# Patient Record
Sex: Male | Born: 1965 | Race: Black or African American | Hispanic: No | Marital: Married | Smoking: Never smoker
Health system: Southern US, Community
[De-identification: ages and names within clinical notes are randomized; demographics above are authoritative.]

## PROBLEM LIST (undated history)

## (undated) DIAGNOSIS — E119 Type 2 diabetes mellitus without complications: Secondary | ICD-10-CM

## (undated) DIAGNOSIS — E785 Hyperlipidemia, unspecified: Secondary | ICD-10-CM

## (undated) DIAGNOSIS — R011 Cardiac murmur, unspecified: Secondary | ICD-10-CM

## (undated) DIAGNOSIS — G4730 Sleep apnea, unspecified: Secondary | ICD-10-CM

## (undated) HISTORY — DX: Cardiac murmur, unspecified: R01.1

## (undated) HISTORY — DX: Hyperlipidemia, unspecified: E78.5

## (undated) HISTORY — DX: Type 2 diabetes mellitus without complications: E11.9

## (undated) HISTORY — DX: Sleep apnea, unspecified: G47.30

---

## 2001-12-06 ENCOUNTER — Ambulatory Visit (HOSPITAL_BASED_OUTPATIENT_CLINIC_OR_DEPARTMENT_OTHER): Admission: RE | Admit: 2001-12-06 | Discharge: 2001-12-06 | Payer: Self-pay | Admitting: Otolaryngology

## 2002-10-28 ENCOUNTER — Ambulatory Visit (HOSPITAL_BASED_OUTPATIENT_CLINIC_OR_DEPARTMENT_OTHER): Admission: RE | Admit: 2002-10-28 | Discharge: 2002-10-28 | Payer: Self-pay | Admitting: Otolaryngology

## 2003-11-12 HISTORY — PX: SINUS SURGERY WITH INSTATRAK: SHX5215

## 2008-06-15 ENCOUNTER — Emergency Department (HOSPITAL_COMMUNITY): Admission: EM | Admit: 2008-06-15 | Discharge: 2008-06-15 | Payer: Self-pay | Admitting: Emergency Medicine

## 2009-05-22 ENCOUNTER — Ambulatory Visit: Payer: Self-pay | Admitting: Family Medicine

## 2009-05-22 DIAGNOSIS — E785 Hyperlipidemia, unspecified: Secondary | ICD-10-CM

## 2009-05-22 DIAGNOSIS — R011 Cardiac murmur, unspecified: Secondary | ICD-10-CM | POA: Insufficient documentation

## 2009-05-22 DIAGNOSIS — D573 Sickle-cell trait: Secondary | ICD-10-CM

## 2009-05-22 DIAGNOSIS — G4730 Sleep apnea, unspecified: Secondary | ICD-10-CM | POA: Insufficient documentation

## 2009-05-22 LAB — CONVERTED CEMR LAB
Cholesterol, target level: 200 mg/dL
HDL goal, serum: 40 mg/dL

## 2009-05-29 ENCOUNTER — Ambulatory Visit: Payer: Self-pay | Admitting: Family Medicine

## 2009-05-29 DIAGNOSIS — R7309 Other abnormal glucose: Secondary | ICD-10-CM | POA: Insufficient documentation

## 2009-05-29 LAB — CONVERTED CEMR LAB
ALT: 25 units/L (ref 0–53)
Alkaline Phosphatase: 43 units/L (ref 39–117)
Chloride: 107 meq/L (ref 96–112)
Cholesterol: 127 mg/dL (ref 0–200)
Creatinine, Ser: 1 mg/dL (ref 0.4–1.5)
GFR calc non Af Amer: 104.6 mL/min (ref >60)
Glucose, Bld: 119 mg/dL — ABNORMAL HIGH (ref 70–99)
Potassium: 3.9 meq/L (ref 3.5–5.1)
TSH: 1.77 microintl units/mL (ref 0.35–5.50)
Total Protein: 6.7 g/dL (ref 6.0–8.3)

## 2009-07-17 ENCOUNTER — Emergency Department (HOSPITAL_COMMUNITY): Admission: EM | Admit: 2009-07-17 | Discharge: 2009-07-17 | Payer: Self-pay | Admitting: Emergency Medicine

## 2009-08-14 ENCOUNTER — Encounter: Admission: RE | Admit: 2009-08-14 | Discharge: 2009-08-14 | Payer: Self-pay | Admitting: Otolaryngology

## 2009-11-16 ENCOUNTER — Ambulatory Visit: Payer: Self-pay | Admitting: Family Medicine

## 2009-11-30 ENCOUNTER — Ambulatory Visit: Payer: Self-pay | Admitting: Family Medicine

## 2009-11-30 LAB — CONVERTED CEMR LAB
ALT: 32 units/L (ref 0–53)
Alkaline Phosphatase: 43 units/L (ref 39–117)
BUN: 10 mg/dL (ref 6–23)
Bilirubin, Direct: 0.1 mg/dL (ref 0.0–0.3)
CO2: 30 meq/L (ref 19–32)
Calcium: 9.2 mg/dL (ref 8.4–10.5)
Chloride: 105 meq/L (ref 96–112)
Cholesterol: 177 mg/dL (ref 0–200)
Creatinine, Ser: 1.1 mg/dL (ref 0.4–1.5)
HDL: 27.4 mg/dL — ABNORMAL LOW (ref >39.00)
Potassium: 3.8 meq/L (ref 3.5–5.1)
Sodium: 141 meq/L (ref 135–145)
Total Bilirubin: 1.7 mg/dL — ABNORMAL HIGH (ref 0.3–1.2)
Triglycerides: 91 mg/dL (ref 0.0–149.0)

## 2009-12-04 ENCOUNTER — Encounter (INDEPENDENT_AMBULATORY_CARE_PROVIDER_SITE_OTHER): Payer: Self-pay | Admitting: *Deleted

## 2009-12-08 ENCOUNTER — Telehealth: Payer: Self-pay | Admitting: Family Medicine

## 2010-03-06 ENCOUNTER — Ambulatory Visit: Payer: Self-pay | Admitting: Family Medicine

## 2010-03-06 DIAGNOSIS — S139XXA Sprain of joints and ligaments of unspecified parts of neck, initial encounter: Secondary | ICD-10-CM | POA: Insufficient documentation

## 2010-03-22 ENCOUNTER — Ambulatory Visit: Payer: Self-pay | Admitting: Family Medicine

## 2010-03-22 DIAGNOSIS — M67919 Unspecified disorder of synovium and tendon, unspecified shoulder: Secondary | ICD-10-CM | POA: Insufficient documentation

## 2010-03-22 DIAGNOSIS — R209 Unspecified disturbances of skin sensation: Secondary | ICD-10-CM

## 2010-03-22 DIAGNOSIS — M719 Bursopathy, unspecified: Secondary | ICD-10-CM

## 2010-10-31 ENCOUNTER — Encounter: Payer: Self-pay | Admitting: Family Medicine

## 2010-10-31 ENCOUNTER — Ambulatory Visit: Payer: Self-pay | Admitting: Family Medicine

## 2010-10-31 DIAGNOSIS — R079 Chest pain, unspecified: Secondary | ICD-10-CM

## 2010-11-11 HISTORY — PX: OTHER SURGICAL HISTORY: SHX169

## 2010-11-13 ENCOUNTER — Ambulatory Visit: Admission: RE | Admit: 2010-11-13 | Discharge: 2010-11-13 | Payer: Self-pay | Source: Home / Self Care

## 2010-11-13 ENCOUNTER — Ambulatory Visit
Admission: RE | Admit: 2010-11-13 | Discharge: 2010-11-13 | Payer: Self-pay | Source: Home / Self Care | Attending: Physician Assistant | Admitting: Physician Assistant

## 2010-12-10 ENCOUNTER — Encounter: Payer: Self-pay | Admitting: Family Medicine

## 2010-12-10 ENCOUNTER — Telehealth: Payer: Self-pay | Admitting: Family Medicine

## 2010-12-11 NOTE — Assessment & Plan Note (Signed)
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  The neck morning had a lot of pain i nhis neck and shoulder then came to the doctor.   Took some flexeril.    his range of motion and pain in the shoulder is actually improved. His neck pain is improved. He still does have some pain in his neck and trap. His primary concern is the paresthesias that he has been intermittently feeling  Allergies (verified): No Known Drug Allergies  Past History:  Past medical, surgical, family and social histories (including risk factors) reviewed, and no changes noted (except as noted below).  Past Medical History: Reviewed history from 05/22/2009 and no changes required. Hyperlipidemia  Past Surgical History: Reviewed history from 05/22/2009 and no changes required. Sinus surgery 2005  Family History: Reviewed history from 05/22/2009 and no changes required. mother: lymphoma father: healthy half brother: Doreatha Martin disease siblings: healthy MGM: dementia no MI less than age 23  Social History: Reviewed history from 05/22/2009 and no changes required. 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 Diet: some fast food, fruits and  veggies, water  Review of Systems       REVIEW OF SYSTEMS  GEN: No systemic complaints, no fevers, chills, sweats, or other acute illnesses MSK: Detailed in the HPI GI: tolerating PO intake without difficulty Neuro: as described above, no weakness. Otherwise the pertinent positives of the ROS are noted above.    Physical Exam  General:  GEN: Well-developed,well-nourished,in no acute distress; alert,appropriate and cooperative throughout examination HEENT: Normocephalic and atraumatic without obvious abnormalities. No apparent alopecia or balding. Ears, externally no deformities PULM: Breathing comfortably in no respiratory distress EXT: No clubbing, cyanosis, or edema PSYCH: Normally interactive. Cooperative during the interview. Pleasant. Friendly and conversant. Not anxious or depressed appearing. Normal, full affect.  Msk:  neck: Full range of motion, negative Spurling's. Nontender in all bony prominences. He does have some mild paracervical spinal tenderness.  Full range of motion. Strength testing is 5/5.  Positive a.c. crossover compression test. Mildly tender at the acromioclavicular joint.  Mildly tender arc of motion. Negative Jobe test. Negative sulcus test.  mild + Neer test. Positive Leanord Asal test.   Impression & Recommendations:  Problem # 1:  ROTATOR CUFF SYNDROME, LEFT (ICD-726.10) patient has some mild a.c.  joint  pain, likely sustained a  mild grade 1 separation.  Additionally, he is having some rotator cuff tendinopathy.  He also is having some paresthesias in his left hand.  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 These typically do resolve with time, though they can take weeks to months.  Think it is reasonable to do a short course of oral steroid  given RTC and scapular stabilization routine from [facility]  Problem # 2:  NECK SPRAIN AND STRAIN (ICD-847.0)  The following medications were removed from the medication  list:    Diclofenac Sodium 75 Mg Tbec (Diclofenac sodium) .Marland Kitchen... 1 tab by mouth two times a day x 2weeks    Cyclobenzaprine Hcl 5 Mg Tabs (Cyclobenzaprine hcl) .Marland Kitchen... 1-2 tab by mouth at bedtime as needed muscle spasm  Problem # 3:  PARESTHESIA (ICD-782.0) Assessment: New  Complete Medication List: 1)  Prednisone 20 Mg Tabs (Prednisone) .... 2 tabs by mouth daily for a week Prescriptions: PREDNISONE 20 MG TABS (PREDNISONE) 2 tabs by mouth daily for a week  #14 x 0   Entered and Authorized by:   Hannah Beat MD   Signed by:   Hannah Beat MD on 03/22/2010   Method used:   [facility] to        [facility] (retail)       [address]       [place], [place]  [address]       Ph: [phone] or [phone]       Fax: [phone]   RxID:   [id number]   Prior Medications (reviewed today): None Current Allergies (reviewed today): No known allergies

## 2010-12-11 NOTE — Assessment & Plan Note (Signed)
Summary: STOMACH PAIN/CLE   Vital Signs:  Patient profile:   45 year old male Height:      77.5 inches Weight:      252.0 pounds BMI:     29.61 Temp:     98.1 degrees F oral Pulse rate:   60 / minute Pulse rhythm:   regular BP sitting:   132 / 80  (left arm) Cuff size:   large  Vitals Entered By: Benny Lennert CMA Duncan Dull) (November 16, 2009 4:19 PM)  History of Present Illness: Chief complaint stomach pain  1 year ago abdominal pain.Marland Kitchen given meds..? what resolved. 3 months later pain with ejaculation.Marland Kitchengiven cipro for prostatitis. 1 month ago saw Urologist...recommended 60 day treatment Took 30 days and was feeling well..stopped medicaiton 1 week ago.now symptom has returned.  Had some stomach bloating.  left lower abdominal pain, intermittant...worse when ejaculate.  no retrograde ejaculate. no dysuria. No constipation, no diarrhea.  No blood in stool. No fever. No testicular pain.   Had neg UA and   Problems Prior to Update: 1)  Prediabetes  (ICD-790.29) 2)  Skin Tag  (ICD-701.9) 3)  Sore Throat  (ICD-462) 4)  Sickle Cell Trait  (ICD-282.5) 5)  Heart Murmur, Benign  (ICD-785.2) 6)  Sleep Apnea  (ICD-780.57) 7)  Hyperlipidemia  (ICD-272.4)  Current Medications (verified): 1)  Lipitor 10 Mg Tabs (Atorvastatin Calcium) .... Take 1 Tablet By Mouth Once A Day 2)  Cpap  Allergies (verified): No Known Drug Allergies  Past History:  Past medical, surgical, family and social histories (including risk factors) reviewed, and no changes noted (except as noted below).  Past Medical History: Reviewed history from 05/22/2009 and no changes required. Hyperlipidemia  Past Surgical History: Reviewed history from 05/22/2009 and no changes required. Sinus surgery 2005  Family History: Reviewed history from 05/22/2009 and no changes required. mother: lymphoma father: healthy half brother: Doreatha Martin disease siblings: healthy MGM: dementia no MI less than age  24  Social History: Reviewed history from 05/22/2009 and no changes required. 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 Diet: some fast food, fruits and veggies, water  Review of Systems General:  Denies fatigue and fever. CV:  Denies chest pain or discomfort. Resp:  Denies shortness of breath. GI:  Denies gas, indigestion, nausea, and vomiting. GU:  Denies decreased libido, discharge, dysuria, genital sores, nocturia, urinary frequency, and urinary hesitancy.  Physical Exam  General:  Well-developed,well-nourished,in no acute distress; alert,appropriate and cooperative throughout examination Mouth:  MMM Lungs:  Normal respiratory effort, chest expands symmetrically. Lungs are clear to auscultation, no crackles or wheezes. Heart:  Normal rate and regular rhythm. S1 and S2 normal without gallop, murmur, click, rub or other extra sounds. Rectal:  refused.Marland Kitchendone at uro 1 month ago  Genitalia:  refused.Marland Kitchendone at uro 1 month ago  Prostate:  refused.Marland Kitchendone at uro 1 month ago    Impression & Recommendations:  Problem # 1:  PROSTATITIS, ACUTE (ICD-601.0) Symptoms correspond with acute/ recurrent bacterial prostatitis. Spent 20 min face to face explaining diagnosis and appropriate treatment as provided by URO. Info given. Recommended restarting antibitoics as was directed by urologist...to complete 60 days treatment. If symptoms return return to [facility] for further recs.   Refused reeval of prostate, genitalia  exam and repeat UA.  Complete Medication List: 1)  Lipitor 10 Mg Tabs (Atorvastatin calcium) .... Take 1 tablet by mouth once a  day 2)  Cpap   Patient Instructions: 1)  Restart antibitoics to complete 60 days course.   Current Allergies (reviewed today): No known allergies

## 2010-12-11 NOTE — Letter (Signed)
Summary: Generic Letter  [facility]  [address] [place], [place] [address]   Phone: [phone]  Fax: [phone]    12/04/2009     Brandon Walls [address] [place], [place]  [address]    Dear Mr. [name],   Notify pt that good chol still low but blood sugar lower than previously...continue working onexercise, weight loss and diet changes.  Make follow up labs prior to CPX in 6 months..CMET, lipids Dx 272.0. Kerby Nora MD  November 30, 2009 5:01 PM   Please call and schedule these appt at your convience.     Sincerely,   Benny Lennert CMA (AAMA)

## 2010-12-11 NOTE — Assessment & Plan Note (Signed)
Summary: 2:45PM / PAIN ON LEFT SIDE NECK & SHOULDER / LFW   Vital Signs:  Patient profile:   45 year old male Height:      77.5 inches Weight:      241.0 pounds BMI:     28.31 Temp:     98.4 degrees F oral Pulse rate:   84 / minute Pulse rhythm:   regular BP sitting:   118 / 80  (left arm) Cuff size:   large  Vitals Entered By: Benny Lennert CMA Duncan Dull) (March 06, 2010 3:04 PM)  History of Present Illness: Chief complaint pain in neck and shoulder for 5 days in left side  5 days ago..when pulling on someting that gave way..arm/ shoulder pulled forward quickly. Awoke next day with left neck stiffness and left lateral shoulder. Some weakness with throwing football in last few days. No numness.  No shooting pain radiating to arm. Iced initially...no meds.   No past history of injury to left arm/shoulder neck.   Problems Prior to Update: 1)  Prostatitis, Acute  (ICD-601.0) 2)  Prediabetes  (ICD-790.29) 3)  Skin Tag  (ICD-701.9) 4)  Sore Throat  (ICD-462) 5)  Sickle Cell Trait  (ICD-282.5) 6)  Heart Murmur, Benign  (ICD-785.2) 7)  Sleep Apnea  (ICD-780.57) 8)  Hyperlipidemia  (ICD-272.4)  Current Medications (verified): 1)  Lipitor 10 Mg Tabs (Atorvastatin Calcium) .... Take 1 Tablet By Mouth Once A Day 2)  Cpap 3)  Diclofenac Sodium 75 Mg Tbec (Diclofenac Sodium) .Marland Kitchen.. 1 Tab By Mouth Two Times A Day X 2weeks 4)  Cyclobenzaprine Hcl 5 Mg Tabs (Cyclobenzaprine Hcl) .Marland Kitchen.. 1-2 Tab By Mouth At Bedtime As Needed Muscle Spasm  Allergies (verified): No Known Drug Allergies  Past History:  Past medical, surgical, family and social histories (including risk factors) reviewed, and no changes noted (except as noted below).  Past Medical History: Reviewed history from 05/22/2009 and no changes required. Hyperlipidemia  Past Surgical History: Reviewed history from 05/22/2009 and no changes required. Sinus surgery 2005  Family History: Reviewed history from 05/22/2009 and no  changes required. mother: lymphoma father: healthy half brother: Doreatha Martin disease siblings: healthy MGM: dementia no MI less than age 21  Social History: Reviewed history from 05/22/2009 and no changes required. 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 Diet: some fast food, fruits and veggies, water  Review of Systems General:  Denies fatigue and fever. ENT:  currently getting over viral URI. CV:  Denies chest pain or discomfort and swelling of feet. Resp:  Denies shortness of breath. GI:  Denies abdominal pain, bloody stools, and constipation.  Physical Exam  General:  Well-developed,well-nourished,in no acute distress; alert,appropriate and cooperative throughout examination Mouth:  Oral mucosa and oropharynx without lesions or exudates.  Teeth in good repair. Neck:  no cervical or supraclavicular lymphadenopathy no carotid bruit or thyromegaly  Lungs:  Normal respiratory effort, chest expands symmetrically. Lungs are clear to auscultation, no crackles or wheezes. Heart:  Normal rate and regular rhythm. S1 and S2 normal without gallop, murmur, click, rub or other extra sounds. Msk:  ttp over trapezius, muscle spasm ttp in post subacromial area Mildly pos Neer's, Neg drop arm Full strength throughout   Impression & Recommendations:  Problem # 1:  NECK SPRAIN AND STRAIN (ICD-847.0) No evidence of radiculopathy or fracture. Treat with NSAIDs, stretching exercises, ice. Limit lifting and repetitive motion. Use muscle  relaxant as needed.  His updated medication list for this problem includes:    Diclofenac Sodium 75 Mg Tbec (Diclofenac sodium) .Marland Kitchen... 1 tab by mouth two times a day x 2weeks    Cyclobenzaprine Hcl 5 Mg Tabs (Cyclobenzaprine hcl) .Marland Kitchen... 1-2 tab by mouth at bedtime as needed muscle spasm  Problem # 2:  SHOULDER PAIN, LEFT  (ICD-719.41)  His updated medication list for this problem includes:    Diclofenac Sodium 75 Mg Tbec (Diclofenac sodium) .Marland Kitchen... 1 tab by mouth two times a day x 2weeks    Cyclobenzaprine Hcl 5 Mg Tabs (Cyclobenzaprine hcl) .Marland Kitchen... 1-2 tab by mouth at bedtime as needed muscle spasm  Complete Medication List: 1)  Lipitor 10 Mg Tabs (Atorvastatin calcium) .... Take 1 tablet by mouth once a day 2)  Cpap  3)  Diclofenac Sodium 75 Mg Tbec (Diclofenac sodium) .Marland Kitchen.. 1 tab by mouth two times a day x 2weeks 4)  Cyclobenzaprine Hcl 5 Mg Tabs (Cyclobenzaprine hcl) .Marland Kitchen.. 1-2 tab by mouth at bedtime as needed muscle spasm  Patient Instructions: 1)  No lifting greater than 10 lbs or repetitive shoulder reaching above head or shoulder inversion/eversion x 10 days. 2)  Antiinflammatory two times a day, muscle relaxant as needed at night.  3)  Gentle stretching and strenghtening exercsies.  4)  Follow up if not improving in 2 weeks.  Prescriptions: CYCLOBENZAPRINE HCL 5 MG TABS (CYCLOBENZAPRINE HCL) 1-2 tab by mouth at bedtime as needed muscle spasm  #20 x 0   Entered and Authorized by:   Kerby Nora MD   Signed by:   Kerby Nora MD on 03/06/2010   Method used:   Print then Give to Patient   RxID:   [id number] DICLOFENAC SODIUM 75 MG TBEC (DICLOFENAC SODIUM) 1 tab by mouth two times a day x 2weeks  #30 x 0   Entered and Authorized by:   Kerby Nora MD   Signed by:   Kerby Nora MD on 03/06/2010   Method used:   Print then Give to Patient   RxID:   [id number]   Current Allergies (reviewed today): No known allergies

## 2010-12-11 NOTE — Progress Notes (Signed)
Summary: fever and cough  Phone Note Call from Patient Call back at Home Phone [phone]   Caller: Patient Call For: Kerby Nora MD Summary of Call: Pt states he has cough, fever up to 102, achy, headaches- x 2-3 days.  Fever wont come down without tylenol.  He is resting and getting fluids.  He didnt get a flu shot.  Please advise on what to do. Initial call taken by: Lowella Petties CMA,  December 08, 2009 12:45 PM  Follow-up for Phone Call        Possible influenza...if no SOB.Marland Kitchencontinue home management. Rest fluids and antipyretic. If interested in tamiflu.. oif symptroms started in last 48-72 hours he is candidate..helps some with decreasing symptoms and length of symptoms..but not much.   Follow-up by: Kerby Nora MD,  December 08, 2009 2:12 PM  Additional Follow-up for Phone Call Additional follow up Details #1::        Patient notified as instructed.  Would like Rx for Tamiflu.  Dr. Ermalene Searing gave verbal order to send in Tamiflu 75mg , #10 with no refills.  [facility] Additional Follow-up by: Linde Gillis CMA Duncan Dull),  December 08, 2009 3:11 PM    New/Updated Medications: TAMIFLU 75 MG CAPS (OSELTAMIVIR PHOSPHATE) take one tablet twice daily for five days Prescriptions: TAMIFLU 75 MG CAPS (OSELTAMIVIR PHOSPHATE) take one tablet twice daily for five days  #10 x 0   Entered by:   Linde Gillis CMA (AAMA)   Authorized by:   Kerby Nora MD   Signed by:   Linde Gillis CMA (AAMA) on 12/08/2009   Method used:   [facility]        [facility] [address] [id number]       [address]       [place], [place]  [address]       Ph: [phone] or [phone]       Fax: [phone]   RxID:   [id number]   Prior Medications: LIPITOR 10 MG TABS (ATORVASTATIN CALCIUM) Take 1 tablet by mouth once a day CPAP ()  Current Allergies: No known allergies

## 2010-12-13 NOTE — Assessment & Plan Note (Signed)
Summary: ?PULLED MUSCLE,PAIN IN CHEST/CLE   Vital Signs:  Patient profile:   45 year old male Height:      77.5 inches Weight:      251 pounds BMI:     29.49 Temp:     98.7 degrees F oral Pulse rate:   84 / minute Pulse rhythm:   regular BP sitting:   120 / 70  (left arm) Cuff size:   regular  Vitals Entered By: Benny Lennert CMA Duncan Dull) (October 31, 2010 10:51 AM)  History of Present Illness: Chief complaint ? pulled muscle pain in chest  44 year old male:  Chest pain:  Past few weeks, after working out, tight through the whole day. Was feeling fine. Laying on his left side.  Does weights - but does not bother when doing some cardio.  Works tight when up in the chest area.   chest pain, substernally, that will last for hours after onset.  Does not begin with exertional component. No smoking history. No diabetes. No hypertension. He does have a history of hyperlipidemia, however most recently his numbers look pretty good.  Mother does have a history of myocardial infarction at age 64. No other cardiac risk factors. He is obese.   Mom: MI at 42, CAD.   Clinical Review Panels:  Lipid Management   Cholesterol:  177 (11/30/2009)   LDL (bad choesterol):  131 (11/30/2009)   HDL (good cholesterol):  27.40 (11/30/2009)  Complete Metabolic Panel   Glucose:  109 (11/30/2009)   Sodium:  141 (11/30/2009)   Potassium:  3.8 (11/30/2009)   Chloride:  105 (11/30/2009)   CO2:  30 (11/30/2009)   BUN:  10 (11/30/2009)   Creatinine:  1.1 (11/30/2009)   Albumin:  4.1 (11/30/2009)   Total Protein:  7.0 (11/30/2009)   Calcium:  9.2 (11/30/2009)   Total Bili:  1.7 (11/30/2009)   Alk Phos:  43 (11/30/2009)   SGPT (ALT):  32 (11/30/2009)   SGOT (AST):  22 (11/30/2009)   Allergies (verified): No Known Drug Allergies  Past History:  Past medical, surgical, family and social histories (including risk factors) reviewed, and no changes noted (except as noted below).  Past  Medical History: Reviewed history from 05/22/2009 and no changes required. Hyperlipidemia  Past Surgical History: Reviewed history from 05/22/2009 and no changes required. Sinus surgery 2005  Family History: Reviewed history from 05/22/2009 and no changes required. mother: lymphoma father: healthy half brother: Doreatha Martin disease siblings: healthy MGM: dementia no MI less than age 29  Social History: Reviewed history from 05/22/2009 and no changes required. 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 Diet: some fast food, fruits and veggies, water  Review of Systems General:  Denies chills, fatigue, and fever. CV:  Complains of chest pain or discomfort; denies bluish discoloration of lips or nails, difficulty breathing at night, difficulty breathing while lying down, fainting, fatigue, leg cramps with exertion, lightheadness, near fainting, palpitations, shortness of breath with exertion, swelling of feet, swelling of hands, and weight gain. Resp:  Denies chest pain with inspiration.  Physical Exam  General:  Well-developed,well-nourished,in no acute distress; alert,appropriate and cooperative throughout examination Head:  Normocephalic and atraumatic without obvious abnormalities. No apparent alopecia or balding. Ears:  no external deformities.   Nose:  no external deformity.   Chest Wall:  some minimal pain with sternal compression. Lungs:  Normal respiratory effort, chest expands  symmetrically. Lungs are clear to auscultation, no crackles or wheezes. Heart:  Normal rate and regular rhythm. S1 and S2 normal without gallop, murmur, click, rub or other extra sounds.   Impression & Recommendations:  Problem # 1:  CHEST PAIN (ICD-786.50) Assessment New EKG: Normal sinus rhythm. Normal axis, normal R wave progression, No acute ST elevation or  depression. pr 240, inverted T in III.  45 year old black male substernal chest pain, atypical. Mother with a history of myocardial infarction age 35 and a history of hyperlipidemia. Will obtain an exercise tolerance test for risk stratification. He is able currently to exercise well, and I advised him to push the Bruce protocol.  If his stress test is positive, cardiology consult urgently.  I think this is more likely costochondritis, and I have advised him Alleve 220 mg p.o. b.i.d.  Orders: Cardiology Referral (Cardiology) EKG w/ Interpretation (93000)  Patient Instructions: 1)  Alleve 2 tabs by mouth two times a day over the counter, take at least for 2 - 3 weeks. This is a prescription strength dose.  2)  Referral Appointment Information 3)  Day/Date: 4)  Time: 5)  Place/MD: 6)  Address: 7)  Phone/Fax: 8)  Patient given appointment information. Information/Orders faxed/mailed.    Orders Added: 1)  Cardiology Referral [Cardiology] 2)  Est. Patient Level IV [19147] 3)  EKG w/ Interpretation [93000]    Prior Medications (reviewed today): None Current Allergies (reviewed today): No known allergies

## 2010-12-19 NOTE — Progress Notes (Signed)
  Phone Note Other Incoming   Details for Reason: Old Records Summary of Call: reivewed old records. Pt due for CPX and fasting labs prior. Please schedule.   No past tetanus seen.  Initial call taken by: Kerby Nora MD,  December 10, 2010 1:44 PM  Follow-up for Phone Call        Patient has appt in march for labs and cpx Follow-up by: Benny Lennert CMA Duncan Dull),  December 11, 2010 11:08 AM

## 2011-01-02 NOTE — Letter (Signed)
Summary: [facility] Patient Records  [facility] Patient Records   Imported By: Kassie Mends 12/24/2010 11:26:02  _____________________________________________________________________  External Attachment:    Type:   Image     Comment:   External Document

## 2011-01-05 ENCOUNTER — Encounter: Payer: Self-pay | Admitting: Family Medicine

## 2011-01-15 ENCOUNTER — Encounter: Payer: Self-pay | Admitting: Family Medicine

## 2011-01-15 DIAGNOSIS — E785 Hyperlipidemia, unspecified: Secondary | ICD-10-CM

## 2011-01-28 ENCOUNTER — Other Ambulatory Visit: Payer: Self-pay

## 2011-02-04 ENCOUNTER — Encounter: Payer: Self-pay | Admitting: Family Medicine

## 2011-02-15 LAB — URINE CULTURE
Colony Count: NO GROWTH
Culture: NO GROWTH

## 2011-02-15 LAB — POCT URINALYSIS DIP (DEVICE)
Glucose, UA: NEGATIVE mg/dL
Urobilinogen, UA: 0.2 mg/dL (ref 0.0–1.0)

## 2011-02-15 LAB — HEMOCCULT GUIAC POC 1CARD (OFFICE): Fecal Occult Bld: NEGATIVE

## 2011-02-18 ENCOUNTER — Encounter: Payer: Self-pay | Admitting: Family Medicine

## 2011-02-20 ENCOUNTER — Telehealth: Payer: Self-pay | Admitting: Family Medicine

## 2011-02-20 DIAGNOSIS — E7800 Pure hypercholesterolemia, unspecified: Secondary | ICD-10-CM

## 2011-02-20 NOTE — Telephone Encounter (Signed)
Message copied by Kerby Nora on Wed Feb 20, 2011  6:47 PM ------      Message from: Melody Comas      Created: Wed Feb 20, 2011 11:23 AM      Regarding: CPX labs for Monday        Patient having cpx labs, please order future labs.

## 2011-02-20 NOTE — Telephone Encounter (Signed)
Message copied by Kerby Nora on Wed Feb 20, 2011  6:48 PM ------      Message from: Melody Comas      Created: Wed Feb 20, 2011 11:23 AM      Regarding: CPX labs for Monday        Patient having cpx labs, please order future labs.

## 2011-02-25 ENCOUNTER — Other Ambulatory Visit (INDEPENDENT_AMBULATORY_CARE_PROVIDER_SITE_OTHER): Payer: BLUE CROSS/BLUE SHIELD | Admitting: Family Medicine

## 2011-02-25 DIAGNOSIS — E785 Hyperlipidemia, unspecified: Secondary | ICD-10-CM

## 2011-02-25 DIAGNOSIS — E7800 Pure hypercholesterolemia, unspecified: Secondary | ICD-10-CM

## 2011-02-25 LAB — COMPREHENSIVE METABOLIC PANEL
ALT: 36 U/L (ref 0–53)
AST: 19 U/L (ref 0–37)
Albumin: 3.9 g/dL (ref 3.5–5.2)
BUN: 11 mg/dL (ref 6–23)
Calcium: 9.3 mg/dL (ref 8.4–10.5)
Chloride: 102 mEq/L (ref 96–112)
Creatinine, Ser: 1.2 mg/dL (ref 0.4–1.5)
GFR: 88.31 mL/min (ref >60.00)
Glucose, Bld: 183 mg/dL — ABNORMAL HIGH (ref 70–99)
Potassium: 4.1 mEq/L (ref 3.5–5.1)
Total Protein: 6.7 g/dL (ref 6.0–8.3)

## 2011-02-25 LAB — LIPID PANEL
Cholesterol: 205 mg/dL — ABNORMAL HIGH (ref 0–200)
Total CHOL/HDL Ratio: 6
Triglycerides: 169 mg/dL — ABNORMAL HIGH (ref 0.0–149.0)

## 2011-02-25 LAB — LDL CHOLESTEROL, DIRECT: Direct LDL: 133.6 mg/dL

## 2011-03-05 ENCOUNTER — Ambulatory Visit (INDEPENDENT_AMBULATORY_CARE_PROVIDER_SITE_OTHER): Payer: BLUE CROSS/BLUE SHIELD | Admitting: Family Medicine

## 2011-03-05 ENCOUNTER — Encounter: Payer: Self-pay | Admitting: Family Medicine

## 2011-03-05 VITALS — BP 120/80 | HR 80 | Temp 99.0°F | Ht 77.0 in | Wt 251.0 lb

## 2011-03-05 DIAGNOSIS — Z125 Encounter for screening for malignant neoplasm of prostate: Secondary | ICD-10-CM

## 2011-03-05 DIAGNOSIS — Z23 Encounter for immunization: Secondary | ICD-10-CM

## 2011-03-05 DIAGNOSIS — Z0000 Encounter for general adult medical examination without abnormal findings: Secondary | ICD-10-CM

## 2011-03-05 DIAGNOSIS — R7303 Prediabetes: Secondary | ICD-10-CM

## 2011-03-05 DIAGNOSIS — E7800 Pure hypercholesterolemia, unspecified: Secondary | ICD-10-CM

## 2011-03-05 DIAGNOSIS — J029 Acute pharyngitis, unspecified: Secondary | ICD-10-CM | POA: Insufficient documentation

## 2011-03-05 DIAGNOSIS — E785 Hyperlipidemia, unspecified: Secondary | ICD-10-CM

## 2011-03-05 DIAGNOSIS — R7309 Other abnormal glucose: Secondary | ICD-10-CM

## 2011-03-05 NOTE — Progress Notes (Signed)
Subjective:    Patient ID: Brandon Walls, male    DOB: Mar 12, 1966, 45 y.o.   MRN: [id number]  HPI The patient is here for annual wellness exam and preventative care.    He has the following complaints  Sore throat:x 1-2 weeks.. Worse in last 24 hours. Low grade subjective fever. Congestion, sneeze. No cough, no SOB, no cp No ear pain. No sinus ttp. No OTC meds.  High cholesterol: Inadequate control on no medication.   Prediabetes: Not checking blood sugars at home.    Sleep apnea: Was using CPAP.Marland Kitchen But made throat more sore so held it last night.  Review of Systems  Constitutional: Negative for fever, fatigue and unexpected weight change.  HENT: Negative for ear pain, congestion, sore throat, rhinorrhea, trouble swallowing and postnasal drip.   Eyes: Negative for pain.  Respiratory: Negative for cough, shortness of breath and wheezing.   Cardiovascular: Negative for chest pain, palpitations and leg swelling.  Gastrointestinal: Negative for nausea, abdominal pain, diarrhea, constipation and blood in stool.  Genitourinary: Negative for dysuria, urgency, hematuria, discharge, penile swelling, scrotal swelling, difficulty urinating, penile pain and testicular pain.  Skin: Negative for rash.  Neurological: Negative for syncope, weakness, light-headedness, numbness and headaches.  Psychiatric/Behavioral: Negative for behavioral problems and dysphoric mood. The patient is not nervous/anxious.        Objective:   Physical Exam  Constitutional: He appears well-developed and well-nourished.  Non-toxic appearance. He does not appear ill. No distress.  HENT:  Head: Normocephalic and atraumatic.  Right Ear: Hearing, tympanic membrane, external ear and ear canal normal.  Left Ear: Hearing, tympanic membrane, external ear and ear canal normal.  Nose: Nose normal.  Mouth/Throat: Uvula is midline and mucous membranes are normal. Posterior oropharyngeal erythema present. No  oropharyngeal exudate, posterior oropharyngeal edema or tonsillar abscesses.  Eyes: Conjunctivae, EOM and lids are normal. Pupils are equal, round, and reactive to light. No foreign bodies found.  Neck: Trachea normal, normal range of motion and phonation normal. Neck supple. Carotid bruit is not present. No mass and no thyromegaly present.  Cardiovascular: Normal rate, regular rhythm, S1 normal, S2 normal, intact distal pulses and normal pulses.  Exam reveals no gallop.   No murmur heard. Pulmonary/Chest: Breath sounds normal. He has no wheezes. He has no rhonchi. He has no rales.  Abdominal: Soft. Normal appearance and bowel sounds are normal. There is no hepatosplenomegaly. There is no tenderness. There is no rebound, no guarding and no CVA tenderness. No hernia. Hernia confirmed negative in the right inguinal area and confirmed negative in the left inguinal area.  Genitourinary: Prostate normal, testes normal and penis normal. Rectal exam shows no external hemorrhoid, no internal hemorrhoid, no fissure, no mass, no tenderness and anal tone normal. Guaiac negative stool. Prostate is not enlarged and not tender. Right testis shows no mass and no tenderness. Left testis shows no mass and no tenderness. No paraphimosis or penile tenderness.  Lymphadenopathy:    He has no cervical adenopathy.       Right: No inguinal adenopathy present.       Left: No inguinal adenopathy present.  Neurological: He is alert. He has normal strength and normal reflexes. No cranial nerve deficit or sensory deficit. Gait normal.  Skin: Skin is warm, dry and intact. No rash noted.  Psychiatric: He has a normal mood and affect. His speech is normal and behavior is normal. Judgment normal.          Assessment & Plan:  Complete Physical Exam: The patient's preventative maintenance and recommended screening tests for an annual wellness exam were reviewed in full today. Brought up to date unless services  declined.  Counselled on the importance of diet, exercise, and its role in overall health and mortality. The patient's FH and SH was reviewed, including their home life, tobacco status, and drug and alcohol status.

## 2011-03-05 NOTE — Patient Instructions (Addendum)
Work on regular exercise including cardiovascular execise.  Work on low fat and low carb diet. Fish oil 2000 mg daily. Trial of zyrtec at bedtime 10 mg for allergies and sore throat.  Call if not improving after 1-2 weeks.

## 2011-03-18 NOTE — Assessment & Plan Note (Signed)
Poor control. Encouraged exercise, weight loss, healthy eating habits.'

## 2011-03-18 NOTE — Assessment & Plan Note (Signed)
Most likely due to allergies. Start zyrtec  at bedtime. Call if not improving in 2 weeks.

## 2011-03-18 NOTE — Assessment & Plan Note (Signed)
Poor control.  Work on regular exercise including cardiovascular execise.  Work on low fat and low carb diet. Fish oil 2000 mg daily.

## 2011-03-29 NOTE — Op Note (Signed)
NAME:  Brandon Walls, Brandon A.                        ACCOUNT NO.:  [id number]   MEDICAL RECORD NO.:  [id number]                   PATIENT TYPE:  AMB   LOCATION:  [facility]                                  FACILITY:  [facility]   PHYSICIAN:  Suzanna Obey, M.D.                    DATE OF BIRTH:  01/27/1966   DATE OF PROCEDURE:  10/28/2002  DATE OF DISCHARGE:                                 OPERATIVE REPORT   PREOPERATIVE DIAGNOSES:  1. Deviated septum.  2. Turbinate hypertrophy.   POSTOPERATIVE DIAGNOSES:  1. Deviated septum.  2. Turbinate hypertrophy.   PROCEDURES:  1. Septoplasty.  2. Submucous resection of inferior turbinates.   ANESTHESIA:  General endotracheal tube.   ESTIMATED BLOOD LOSS:  Less than 10 cc.   INDICATIONS:  This is a 44 year old who has had a chronic nasal obstruction  problem refractory to medical therapy.  He has significant deviation of his  septum to the left and a very large turbinate on the right.  The patient was  informed of the risks and benefits of the procedure, including bleeding,  infection, perforation, chronic drainage, crusting, change in external  appearance of the nose, numbness of the teeth, and risk of the anesthetic.  All questions were answered, and consent was obtained.   DESCRIPTION OF PROCEDURE:  Patient taken to the operating room and placed in  the supine position.  After adequate general endotracheal tube anesthesia,  he was placed in the supine position and prepped and draped in the usual  sterile manner.  Oxymetazoline pledgets were placed into the nose  bilaterally and the septum and inferior turbinates were injected with 1%  lidocaine with 1:100,000 epinephrine.  The left hemitransfixion incision was  performed, raising a mucoperichondrial and osteal flap.  The cartilage was  divided about 2 cm posterior to the caudal strut, and this significantly  deviated cartilage was removed with a Therapist, nutritional.  The opposite flap was  elevated and a  Jansen-Middleton forceps were used to remove the deviated  portion of the bone, which also was quite deviated.  This corrected the  septal deflection.  The inferior turbinates were infractured and a midline  incision made with a 15 blade and a mucosal flap elevated superiorly.  The  inferior mucosa and bone were removed with turbinate scissors.  The edges  cauterized with suction cautery.  The hemitransfixion incision was then  closed with interrupted 4-0 chromic and a 4-0 plain gut placed through the  septum.  The turbinates were outfractured and the mucosal flap  laid back down over the raw surface.  Suctioning out of all blood and debris  from the nasopharynx.  A Telfa roll soaked in bacitracin was placed into the  nose bilaterally and secured with a 3-0 nylon.  The patient was awakened and  brought to recovery in stable condition.  Counts correct.  Suzanna Obey, M.D.    Cordelia Pen  D:  10/28/2002  T:  10/29/2002  Job:  [id number]

## 2011-05-23 ENCOUNTER — Ambulatory Visit: Payer: BLUE CROSS/BLUE SHIELD | Admitting: Family Medicine

## 2011-05-29 ENCOUNTER — Other Ambulatory Visit (INDEPENDENT_AMBULATORY_CARE_PROVIDER_SITE_OTHER): Payer: BLUE CROSS/BLUE SHIELD

## 2011-05-29 DIAGNOSIS — R7309 Other abnormal glucose: Secondary | ICD-10-CM

## 2011-05-29 DIAGNOSIS — Z125 Encounter for screening for malignant neoplasm of prostate: Secondary | ICD-10-CM

## 2011-05-29 DIAGNOSIS — E7800 Pure hypercholesterolemia, unspecified: Secondary | ICD-10-CM

## 2011-05-29 DIAGNOSIS — R7303 Prediabetes: Secondary | ICD-10-CM

## 2011-05-29 LAB — HEMOGLOBIN A1C: Hgb A1c MFr Bld: 10.7 % — ABNORMAL HIGH (ref 4.6–6.5)

## 2011-05-29 LAB — BASIC METABOLIC PANEL
Calcium: 9.2 mg/dL (ref 8.4–10.5)
Creatinine, Ser: 1.1 mg/dL (ref 0.4–1.5)
Sodium: 139 mEq/L (ref 135–145)

## 2011-05-29 LAB — LIPID PANEL
LDL Cholesterol: 138 mg/dL — ABNORMAL HIGH (ref 0–99)
Total CHOL/HDL Ratio: 6

## 2011-06-01 LAB — HM DIABETES EYE EXAM

## 2011-06-05 ENCOUNTER — Ambulatory Visit (INDEPENDENT_AMBULATORY_CARE_PROVIDER_SITE_OTHER): Payer: BC Managed Care – PPO | Admitting: Family Medicine

## 2011-06-05 ENCOUNTER — Encounter: Payer: Self-pay | Admitting: Family Medicine

## 2011-06-05 DIAGNOSIS — E785 Hyperlipidemia, unspecified: Secondary | ICD-10-CM

## 2011-06-05 DIAGNOSIS — E119 Type 2 diabetes mellitus without complications: Secondary | ICD-10-CM

## 2011-06-05 MED ORDER — METFORMIN HCL ER 500 MG PO TB24: 500.0000 mg | ORAL_TABLET | Freq: Every day | ORAL | Status: DC

## 2011-06-05 NOTE — Patient Instructions (Addendum)
Stop at front desk to arrange.  Work on starting regular exercise.  Work on low cholesterol and low carbohydrate diet as in info packets. Stop at front desk for nutrition referral.  Check blood sugars in mornings, record once meter obtained. Bring measurements to next visit. Start metformin daily. Start daily baby aspirin.  Follow up in 1 month for 30 min DM appt, no labs prior.

## 2011-06-05 NOTE — Progress Notes (Signed)
  Subjective:    Patient ID: Brandon Walls, male    DOB: 03/14/1966, 45 y.o.   MRN: [id number]  HPI  45 year old male  Presents for follow up with labs.   New diagnosis DM Not check CBGs at home. Has noted bliurred visio intermittantly.  No previous meds.  Poor diet. Has gained weight. No exercise. Wife is present to discuss nutrition and cooking changes needed.   Elevated Cholesterol:Not at goal <LDL100.  On no medication.    Review of Systems  Constitutional: Negative for fever, fatigue and unexpected weight change.  HENT: Negative for ear pain, congestion, sore throat, rhinorrhea, trouble swallowing and postnasal drip.   Eyes: Positive for visual disturbance.  Respiratory: Negative for cough, shortness of breath and wheezing.   Cardiovascular: Negative for chest pain, palpitations and leg swelling.  Gastrointestinal: Negative for nausea, abdominal pain, diarrhea, constipation and blood in stool.  Genitourinary: Negative for dysuria, urgency, hematuria, discharge, penile swelling, scrotal swelling, difficulty urinating, penile pain and testicular pain.  Skin: Negative for rash.  Neurological: Negative for syncope, weakness, light-headedness, numbness and headaches.  Psychiatric/Behavioral: Negative for behavioral problems and dysphoric mood. The patient is not nervous/anxious.        Objective:   Physical Exam  Constitutional: Vital signs are normal. He appears well-developed and well-nourished.  HENT:  Head: Normocephalic.  Right Ear: Hearing normal.  Left Ear: Hearing normal.  Nose: Nose normal.  Mouth/Throat: Oropharynx is clear and moist and mucous membranes are normal.  Neck: Trachea normal. Carotid bruit is not present. No mass and no thyromegaly present.  Cardiovascular: Normal rate, regular rhythm and normal pulses.  Exam reveals no gallop, no distant heart sounds and no friction rub.   No murmur heard.      No peripheral edema  Pulmonary/Chest: Effort  normal and breath sounds normal. No respiratory distress.  Skin: Skin is warm, dry and intact. No rash noted.  Psychiatric: He has a normal mood and affect. His speech is normal and behavior is normal. Thought content normal.          Assessment & Plan:

## 2011-06-10 ENCOUNTER — Encounter: Payer: BC Managed Care – PPO | Attending: Family Medicine

## 2011-06-10 DIAGNOSIS — Z713 Dietary counseling and surveillance: Secondary | ICD-10-CM | POA: Insufficient documentation

## 2011-06-10 DIAGNOSIS — E119 Type 2 diabetes mellitus without complications: Secondary | ICD-10-CM | POA: Insufficient documentation

## 2011-06-10 NOTE — Progress Notes (Signed)
  Patient was seen on 06/10/2011 for the first of a series of three diabetes self-management courses at [facility]. The following learning objectives were met by the patient during this course:   Defines diabetes and the role of insulin  Identifies type of diabetes and pathophysiology  States normal BG range and personal goals  Identifies three risk factors for the development of diabetes  States the need for and frequency of healthcare follow up (ADA Standards of Care)  Lab Results  Component Value Date   HGBA1C 10.7* 05/29/2011    Patient has established the following initial goals:  Follow a Diabetes Meal Plan  Lose Weight  Follow-Up Plan: Attend Diabetes Core Classes @ [facility] in August 2012

## 2011-06-18 ENCOUNTER — Telehealth: Payer: Self-pay | Admitting: *Deleted

## 2011-06-18 NOTE — Telephone Encounter (Signed)
Pt wants referral to [facility] for glucose monitoring training.  Angie says that when you put in the order make sure you put in as dept NDM, when choices come up choose [place].

## 2011-06-21 ENCOUNTER — Telehealth: Payer: Self-pay | Admitting: *Deleted

## 2011-06-21 NOTE — Telephone Encounter (Signed)
Pt's wife called to see if pt can have a glucometer called in.  I advised her that pt is being referred to glucose monitoring class so perhaps pt should wait until that class to get glucometer, since right now he doesn't know how to use glucometer.  Wife agreed.

## 2011-06-25 ENCOUNTER — Encounter: Payer: BC Managed Care – PPO | Attending: Family Medicine

## 2011-06-25 DIAGNOSIS — E119 Type 2 diabetes mellitus without complications: Secondary | ICD-10-CM | POA: Insufficient documentation

## 2011-06-25 DIAGNOSIS — Z713 Dietary counseling and surveillance: Secondary | ICD-10-CM | POA: Insufficient documentation

## 2011-06-30 NOTE — Progress Notes (Signed)
  Patient was seen on 06/24/2013 for the second of a series of three diabetes self-management courses at the [facility]. The following learning objectives were met by the patient during this course:   States the relationship of exercise to blood glucose  States benefits/barriers of regular and safe exercise  States three guidelines for safe and effective exercise  Describes personal diabetes medicine regimen  Describes actions of own medications  Describes causes, symptoms, and treatment of hypo/hyperglycemia  Describes sick day rules  Identifies when to test urine for ketones when appropriate  Identifies when to call healthcare provider for acute complications  States the risk for problems with foot, skin, and dental care  States preventative foot, skin, and dental care measures  States when to call healthcare provider regarding foot, skin, and dental care  Identifies methods for evaluation of diabetes control  Discusses benefits of SBGM  Identifies relationship between nutrition, exercise, medication, and glucose levels  Discusses the importance of record keeping  Follow-Up Plan: Patient will attend the final class of the ADA Diabetes Self-Care Education.

## 2011-07-01 ENCOUNTER — Ambulatory Visit (INDEPENDENT_AMBULATORY_CARE_PROVIDER_SITE_OTHER): Payer: BC Managed Care – PPO | Admitting: Family Medicine

## 2011-07-01 ENCOUNTER — Encounter: Payer: Self-pay | Admitting: Family Medicine

## 2011-07-01 DIAGNOSIS — E119 Type 2 diabetes mellitus without complications: Secondary | ICD-10-CM

## 2011-07-01 NOTE — Assessment & Plan Note (Signed)
Likely improved control with lifestyle  Change. Questions answered today.  Continue with nutrition referral. Continue metformin. Call in 1-2 weeks with [facility], may need to increase metformin.

## 2011-07-01 NOTE — Assessment & Plan Note (Signed)
New diagnosis, start metformin. Encouraged exercise, weight loss, healthy eating habits.  Referred to nutritionist.  Total visit time 30 minutes, > 50% spent counseling and cordinating patients care.

## 2011-07-01 NOTE — Patient Instructions (Signed)
Continue great work on diet and exercise.  Check blood sugars fasting in AM and 2 hours after meals ( does not have to be every meal, every day) Call in 1-2 weeks with blood sugar measurements.

## 2011-07-01 NOTE — Assessment & Plan Note (Signed)
Inadequate control... Will start with lifestyle changes. May need statin medication. Re-check in 3 months.

## 2011-07-01 NOTE — Progress Notes (Signed)
  Subjective:    Patient ID: Brandon Walls, male    DOB: [birth year], 45 y.o.   MRN: [id number]  HPI  1 month follow up.  Diabetes:  Recent new diagnosis and new start on metformin.  He has been on this 1 month.  He has been referred to nutritionist,  Using medications without difficulties: No SE. Hypoglycemic episodes: Has not gotten meter.. Needs prescription today. Hyperglycemic episodes: " Feet problems:None Blood Sugars averaging: not checking yet, will start FBS and one 2 hr pp daily eye exam within last year: Yes , 1 month ago  Has been working on diet changes, exercsie at gym. He has lost 9 lbs in last month!!    Review of Systems  Constitutional: Negative for fever and fatigue.  Respiratory: Negative for chest tightness and shortness of breath.   Cardiovascular: Negative for chest pain, palpitations and leg swelling.  Gastrointestinal: Negative for abdominal pain, diarrhea, constipation and blood in stool.       Objective:   Physical Exam  Constitutional: Vital signs are normal. He appears well-developed and well-nourished.  HENT:  Head: Normocephalic.  Right Ear: Hearing normal.  Left Ear: Hearing normal.  Nose: Nose normal.  Mouth/Throat: Oropharynx is clear and moist and mucous membranes are normal.  Neck: Trachea normal. Carotid bruit is not present. No mass and no thyromegaly present.  Cardiovascular: Normal rate, regular rhythm and normal pulses.  Exam reveals no gallop, no distant heart sounds and no friction rub.   No murmur heard.      No peripheral edema  Pulmonary/Chest: Effort normal and breath sounds normal. No respiratory distress.  Skin: Skin is warm, dry and intact. No rash noted.  Psychiatric: He has a normal mood and affect. His speech is normal and behavior is normal. Thought content normal.    Diabetic foot exam: Normal inspection No skin breakdown No calluses  Normal DP pulses Normal sensation to light touch and monofilament Nails  normal       Assessment & Plan:

## 2011-07-07 NOTE — Progress Notes (Signed)
  Patient was seen on 07/02/2011 for the third of a series of three diabetes self-management courses at the [facility]. The following learning objectives were met by the patient during this course:   Identifies nutrient effects on glycemia  States the general guidelines of meal planning  Relates understanding of personal meal plan  Describes situations that cause stress and discuss methods of stress management  Identifies lifestyle behaviors for change  The following handouts were given in class:  Novo [employer] Carbohydrate Counting book  3 Month Follow Up Visit handout  Goal setting handout  Class evaluation form  Your patient has established the following 3 month goals for diabetes self-care:  Walk/bike/swim/aerobic exercise for 30 minutes, 4 times weekly  Follow-Up Plan: Patient will attend a 3 month follow-up visit for diabetes self-management education.

## 2011-07-24 ENCOUNTER — Ambulatory Visit (INDEPENDENT_AMBULATORY_CARE_PROVIDER_SITE_OTHER): Payer: BC Managed Care – PPO | Admitting: Family Medicine

## 2011-07-24 ENCOUNTER — Encounter: Payer: Self-pay | Admitting: Family Medicine

## 2011-07-24 ENCOUNTER — Inpatient Hospital Stay (HOSPITAL_COMMUNITY)
Admission: RE | Admit: 2011-07-24 | Discharge: 2011-07-24 | Disposition: A | Discharge status: Home / Self Care | Payer: BC Managed Care – PPO | Source: Ambulatory Visit | Attending: Family Medicine | Admitting: Family Medicine

## 2011-07-24 ENCOUNTER — Ambulatory Visit: Payer: BC Managed Care – PPO | Admitting: Family Medicine

## 2011-07-24 VITALS — BP 120/70 | HR 56 | Temp 98.8°F | Ht 77.0 in | Wt 236.8 lb

## 2011-07-24 DIAGNOSIS — M549 Dorsalgia, unspecified: Secondary | ICD-10-CM

## 2011-07-24 DIAGNOSIS — R1032 Left lower quadrant pain: Secondary | ICD-10-CM

## 2011-07-24 LAB — CBC WITH DIFFERENTIAL/PLATELET
Basophils Absolute: 0 10*3/uL (ref 0.0–0.1)
Hemoglobin: 15.4 g/dL (ref 13.0–17.0)
Lymphocytes Relative: 41 % (ref 12.0–46.0)
MCHC: 33 g/dL (ref 30.0–36.0)
MCV: 87 fl (ref 78.0–100.0)
Monocytes Absolute: 0.4 10*3/uL (ref 0.1–1.0)
Monocytes Relative: 10.9 % (ref 3.0–12.0)
Neutro Abs: 1.8 10*3/uL (ref 1.4–7.7)
Neutrophils Relative %: 44.3 % (ref 43.0–77.0)
RBC: 5.36 Mil/uL (ref 4.22–5.81)
RDW: 13.1 % (ref 11.5–14.6)
WBC: 4.1 10*3/uL — ABNORMAL LOW (ref 4.5–10.5)

## 2011-07-24 LAB — POCT URINALYSIS DIPSTICK
Bilirubin, UA: NEGATIVE
Glucose, UA: NEGATIVE
Ketones, UA: NEGATIVE
Leukocytes, UA: NEGATIVE
Nitrite, UA: NEGATIVE
Protein, UA: NEGATIVE
Spec Grav, UA: 1.03
pH, UA: 5

## 2011-07-24 MED ORDER — CIPROFLOXACIN HCL 750 MG PO TABS: 750.0000 mg | ORAL_TABLET | Freq: Two times a day (BID) | ORAL | Status: AC

## 2011-07-24 MED ORDER — METRONIDAZOLE 500 MG PO TABS
500.0000 mg | ORAL_TABLET | Freq: Three times a day (TID) | ORAL | Status: AC
Start: 2011-07-24 — End: 2011-08-03

## 2011-07-24 NOTE — Progress Notes (Signed)
  Subjective:    Patient ID: Brandon Walls, male    DOB: Mar 30, 1966, 45 y.o.   MRN: [id number]  HPI  Brandon Walls, a 45 y.o. male presents today in the office for the following:    Pleasant gentleman who has had some indolent low back pain for about a month, but additionally, he has developed some left flank pain and left lower quadrant abdominal pain over the last week that has been worsening. Now he is developed some nausea. Pain has been increasing. No prior abdominal surgery. Generally he is healthy, and his blood sugars have been controlled, and other medical problems are noncontributory.  Pain for about a week.  Has been severe in the back and flank. Having trouble putting on his pants.  Now with anterior pain. Feels sick now and feels kind of ill. (nausea)  No diarrhea. Eating and drinking No blood.  LLQ abdominal pain Some flank pain  The PMH, PSH, Social History, Family History, Medications, and allergies have been reviewed in [facility], and have been updated if relevant.   Review of Systems ROS: GEN: Acute illness details above GI: Tolerating PO intake GU: maintaining adequate hydration and urination Pulm: No SOB Interactive and getting along well at home.  Otherwise, ROS is as per the HPI.     Objective:   Physical Exam   Physical Exam  Blood pressure 120/70, pulse 56, temperature 98.8 F (37.1 C), temperature source Oral, height 6\' 5"  (1.956 m), weight 236 lb 12.8 oz (107.412 kg), SpO2 97.00%.  GEN: WDWN, NAD, Non-toxic, A & O x 3 HEENT: Atraumatic, Normocephalic. Neck supple. No masses, No LAD. Ears and Nose: No external deformity. CV: RRR, No M/G/R. No JVD. No thrill. No extra heart sounds. PULM: CTA B, no wheezes, crackles, rhonchi. No retractions. No resp. distress. No accessory muscle use. ABD: S, ND, +BS. No rebound tenderness.  Moderately tender in the left lower quadrant, and also tender in the left flank to deep palpation. No CVA tenderness. Patient  does have some low back pain in the region of around S1.  EXTR: No c/c/e NEURO Normal gait.  PSYCH: Normally interactive. Conversant. Not depressed or anxious appearing.  Calm demeanor.        Assessment & Plan:   1. Abdominal pain, left lower quadrant  ciprofloxacin (CIPRO) 750 MG tablet, metroNIDAZOLE (FLAGYL) 500 MG tablet, CBC with Differential, Basic metabolic panel, Hepatic function panel, Lipase  2. Back pain  POCT urinalysis dipstick, ciprofloxacin (CIPRO) 750 MG tablet, metroNIDAZOLE (FLAGYL) 500 MG tablet   UA is negative for any blood.  Left lower quadrant abdominal pain, flank pain, and back pain without definitive etiology. I think most likely consistent with diverticulitis, and will empirically treat. Also check laboratories above. Given strict precautions, if he acutely worsens seek urgent evaluation. Call on Monday if not improved for further evaluation.

## 2011-07-24 NOTE — Patient Instructions (Signed)
Call on Monday if not better - will need to be rechecked

## 2011-07-25 LAB — BASIC METABOLIC PANEL
BUN: 13 mg/dL (ref 6–23)
CO2: 28 mEq/L (ref 19–32)
Chloride: 106 mEq/L (ref 96–112)
GFR: 96.84 mL/min (ref >60.00)
Glucose, Bld: 94 mg/dL (ref 70–99)
Sodium: 140 mEq/L (ref 135–145)

## 2011-07-25 LAB — HEPATIC FUNCTION PANEL
ALT: 22 U/L (ref 0–53)
AST: 19 U/L (ref 0–37)
Alkaline Phosphatase: 44 U/L (ref 39–117)

## 2011-07-26 ENCOUNTER — Emergency Department (HOSPITAL_COMMUNITY): Payer: BC Managed Care – PPO

## 2011-07-26 ENCOUNTER — Ambulatory Visit: Payer: BC Managed Care – PPO | Admitting: Internal Medicine

## 2011-07-26 ENCOUNTER — Emergency Department (HOSPITAL_COMMUNITY)
Admission: EM | Admit: 2011-07-26 | Discharge: 2011-07-26 | Disposition: A | Discharge status: Home / Self Care | Payer: BC Managed Care – PPO | Attending: Emergency Medicine | Admitting: Emergency Medicine

## 2011-07-26 DIAGNOSIS — R109 Unspecified abdominal pain: Secondary | ICD-10-CM | POA: Insufficient documentation

## 2011-07-26 DIAGNOSIS — M545 Low back pain, unspecified: Secondary | ICD-10-CM | POA: Insufficient documentation

## 2011-07-26 DIAGNOSIS — E7800 Pure hypercholesterolemia, unspecified: Secondary | ICD-10-CM | POA: Insufficient documentation

## 2011-07-26 DIAGNOSIS — R112 Nausea with vomiting, unspecified: Secondary | ICD-10-CM | POA: Insufficient documentation

## 2011-07-26 DIAGNOSIS — Z79899 Other long term (current) drug therapy: Secondary | ICD-10-CM | POA: Insufficient documentation

## 2011-07-26 DIAGNOSIS — E119 Type 2 diabetes mellitus without complications: Secondary | ICD-10-CM | POA: Insufficient documentation

## 2011-07-26 LAB — URINALYSIS, ROUTINE W REFLEX MICROSCOPIC
Leukocytes, UA: NEGATIVE
Nitrite: NEGATIVE
Specific Gravity, Urine: 1.018 (ref 1.005–1.030)
pH: 6 (ref 5.0–8.0)

## 2011-07-26 LAB — POCT I-STAT, CHEM 8
BUN: 13 mg/dL (ref 6–23)
Calcium, Ion: 1.22 mmol/L (ref 1.12–1.32)
Chloride: 104 mEq/L (ref 96–112)
Hemoglobin: 16.7 g/dL (ref 13.0–17.0)

## 2011-07-29 ENCOUNTER — Ambulatory Visit (INDEPENDENT_AMBULATORY_CARE_PROVIDER_SITE_OTHER): Payer: BC Managed Care – PPO | Admitting: Family Medicine

## 2011-07-29 ENCOUNTER — Encounter: Payer: Self-pay | Admitting: *Deleted

## 2011-07-29 ENCOUNTER — Encounter: Payer: Self-pay | Admitting: Family Medicine

## 2011-07-29 VITALS — BP 104/76 | HR 70 | Temp 98.5°F | Wt 229.1 lb

## 2011-07-29 DIAGNOSIS — R300 Dysuria: Secondary | ICD-10-CM

## 2011-07-29 DIAGNOSIS — M549 Dorsalgia, unspecified: Secondary | ICD-10-CM | POA: Insufficient documentation

## 2011-07-29 LAB — POCT URINALYSIS DIPSTICK
Glucose, UA: NEGATIVE
Nitrite, UA: NEGATIVE
Spec Grav, UA: 1.02
Urobilinogen, UA: NEGATIVE

## 2011-07-29 MED ORDER — TAMSULOSIN HCL 0.4 MG PO CAPS: 0.4000 mg | ORAL_CAPSULE | Freq: Every day | ORAL | Status: DC

## 2011-07-29 MED ORDER — POLYETHYLENE GLYCOL 3350 17 GM/SCOOP PO POWD: 17.0000 g | Freq: Every day | ORAL | Status: AC | PRN

## 2011-07-29 MED ORDER — NAPROXEN 500 MG PO TABS: ORAL_TABLET | ORAL | Status: DC

## 2011-07-29 NOTE — Progress Notes (Signed)
  Subjective:    Patient ID: Brandon Walls, male    DOB: 05/31/1966, 45 y.o.   MRN: [id number]  HPI CC: back pain  2wk h/o left upper back pain, gradually traveling lower.  Seen here 07/24/2011, dx with.  Thursday night pain worse, Tmax 100, nausea and vomiting, went to ER, given CT scan as well as urine checked.  Negative for kidney stone.  Still with left sided pain, numbness now.  Told may be herniated disc?  Given nausea med and pain meds - oxycodone - which helps.  Pain worse with certain movements he makes.  Pain positional.  Starts left lower back, then travels to left side.  In bed all weekend.  Currently 7/10, gotten as bad as 9/10.    Pt is Games developer, does manual labor.    No fevers/chills,   + constipated.  Appetite down.  Passing some gas.  Review of Systems Per HPI    Objective:   Physical Exam  Nursing note and vitals reviewed. Constitutional: He appears well-developed and well-nourished. No distress.  HENT:  Head: Normocephalic and atraumatic.  Mouth/Throat: Oropharynx is clear and moist. No oropharyngeal exudate.  Eyes: Conjunctivae are normal. Pupils are equal, round, and reactive to light.  Neck: Neck supple.  Cardiovascular: Normal rate, regular rhythm, normal heart sounds and intact distal pulses.   No murmur heard. Pulmonary/Chest: Effort normal and breath sounds normal. No respiratory distress. He has no wheezes. He has no rales.  Abdominal: Soft. Bowel sounds are normal. He exhibits no distension and no mass. There is no tenderness. There is CVA tenderness (mild L). There is no rigidity, no rebound and no guarding.  Musculoskeletal: He exhibits no edema.       Tender midline lower lumbar region, + L paraspinous mm tenderness. Neg SLR bilaterally, no hip pain with int/ ext rotation at hip. Neg FABER.  Skin: Skin is warm and dry. No rash noted.  Psychiatric: He has a normal mood and affect.          Assessment & Plan:

## 2011-07-29 NOTE — Patient Instructions (Addendum)
Out of work until starting to feel better. Stop flagyl.  Continue ciprofloxacin. Start naprosyn twice daily as anti inflammatory, then use oxycodone for breakthrough pain. Start flomax daily for 1 wk. Strainer provided today. Use miralax for constipation, if you still are unable to go or stop passing gas, let us know. Call me with update in 2 days, sooner if worsening.

## 2011-07-29 NOTE — Assessment & Plan Note (Signed)
With some LLQ pain.  Initially treated as diverticulitis with cipro/flagyl, didn't improve and ER eval with normal CT scan (some enlarged prostate).  Currently on narcotics for pain med. Story seems more consistent with kidney stone, however no hematuria, CT negative, blood work overall normal. Will regardless treat as stone with strainer (provided today), flomax and naprosyn, percocets for breakthrough pain. Update if not improving as expected or any worsening. ?coming from spine, vs prostatitis but UA normal. Stop flagyl, continue cipro for now. Endorsing some constipation sxs, will treat with miralax.  passing gas, appetite and PO intake down.Marland Kitchen

## 2011-08-05 ENCOUNTER — Ambulatory Visit (INDEPENDENT_AMBULATORY_CARE_PROVIDER_SITE_OTHER): Payer: BC Managed Care – PPO | Admitting: Family Medicine

## 2011-08-05 ENCOUNTER — Encounter: Payer: Self-pay | Admitting: Family Medicine

## 2011-08-05 DIAGNOSIS — M549 Dorsalgia, unspecified: Secondary | ICD-10-CM

## 2011-08-05 NOTE — Assessment & Plan Note (Signed)
Overall improved, but residual numbness left inguinal region. Advised finish cipro (1-2 days left) to treat possible prostatitis, use naprosyn as needed for possible kidney stone (although never hematuria). Given pain improved, cleared to return to work. No hernia on exam today.  ?coming from lower back although no back pain, not consistent with dermatomal distribution. To update [place] if not improving as expected or any worsening.  Consider back xray vs imaging.

## 2011-08-05 NOTE — Patient Instructions (Signed)
Ok to return to work. Finish cipro, use naprosyn as needed. Return to see [place] if not improving as expected or any worsening.

## 2011-08-05 NOTE — Progress Notes (Signed)
  Subjective:    Patient ID: Brandon Walls, male    DOB: Mar 29, 1966, 45 y.o.   MRN: [id number]  HPI CC: f/u  Seen multiple times here and ER with LLQ and back pain, thought kidney stone vs diverticulitis vs lumbar pain vs prostatitis.  Last visit treated with continued cipro, naprosyn and flomax for presumed kidney stone.  Pain overall improved.  Never passed stone.  Only has residual numbness left lateral hip/thigh.  Did have some constipation, but improved with miralax.  No fevers/chills, bowel or bladder changes, no blood in stool or urine.  No leg weakness, no leg pain, no bowel/bladder incontinence.  Review of Systems Per HPI    Objective:   Physical Exam  Nursing note and vitals reviewed. Constitutional: He appears well-developed and well-nourished. No distress.  HENT:  Head: Normocephalic and atraumatic.  Mouth/Throat: Oropharynx is clear and moist. No oropharyngeal exudate.  Cardiovascular: Normal rate, normal heart sounds and intact distal pulses.   No murmur heard. Pulmonary/Chest: Effort normal and breath sounds normal. No respiratory distress. He has no wheezes. He has no rales.  Abdominal: Soft. Bowel sounds are normal. He exhibits no distension. There is no tenderness. There is no rebound and no guarding.  Musculoskeletal: Normal range of motion. He exhibits no edema.       Right hip: Normal.       Left hip: Normal.       Thoracic back: Normal.       Lumbar back: Normal.       Some pain SI joint on left. Neg SLR test bilaterally. Some tenderness to palpation left inguinal region, no LAD, no hernia appreciated.   Neurological: He has normal strength.  Reflex Scores:      Patellar reflexes are 2+ on the right side and 2+ on the left side.      Achilles reflexes are 2+ on the right side and 2+ on the left side.      5/5 strength LE.  Mild decreased sensation left inguinal region compared to right, temperature sensation intact.  Skin: Skin is warm and dry. No  rash noted.  Psychiatric: He has a normal mood and affect.          Assessment & Plan:

## 2011-08-08 ENCOUNTER — Ambulatory Visit (INDEPENDENT_AMBULATORY_CARE_PROVIDER_SITE_OTHER): Payer: BC Managed Care – PPO | Admitting: Family Medicine

## 2011-08-08 ENCOUNTER — Ambulatory Visit
Admission: RE | Admit: 2011-08-08 | Discharge: 2011-08-08 | Disposition: A | Discharge status: Home / Self Care | Payer: BC Managed Care – PPO | Source: Ambulatory Visit | Attending: Family Medicine | Admitting: Family Medicine

## 2011-08-08 ENCOUNTER — Encounter: Payer: Self-pay | Admitting: Family Medicine

## 2011-08-08 VITALS — BP 120/72 | HR 87 | Temp 98.0°F | Ht 77.0 in | Wt 227.0 lb

## 2011-08-08 DIAGNOSIS — E119 Type 2 diabetes mellitus without complications: Secondary | ICD-10-CM

## 2011-08-08 DIAGNOSIS — M549 Dorsalgia, unspecified: Secondary | ICD-10-CM

## 2011-08-08 MED ORDER — CIPROFLOXACIN HCL 500 MG PO TABS: 500.0000 mg | ORAL_TABLET | Freq: Two times a day (BID) | ORAL | Status: AC

## 2011-08-08 NOTE — Assessment & Plan Note (Signed)
Improving fasting CBGs 110s.

## 2011-08-08 NOTE — Patient Instructions (Addendum)
Stop at front desk to set up referral for imaging.  Start cipro back for 2 more weeks.  Push fluids.  Call us on Monday for report on how you are feeling.

## 2011-08-08 NOTE — Assessment & Plan Note (Signed)
Unclear etiology at this time... Abd CT showed no sig findings other than mild prostate enlargement.  Urine clear serval times, no stone passed.  Some neurologic symptoms and pain now more specifically in low back... ? Herniated disc, but neg SLR.  Agree may be prostate infection with referred pain.. Chronic prostatitis.. Will restart cipro for another 2 weeks.  In meantime eval back with plain films, pt interested in MRI if not improving by early next week.

## 2011-08-08 NOTE — Progress Notes (Signed)
  Subjective:    Patient ID: Brandon Walls, male    DOB: [birth year], 45 y.o.   MRN: [id number]  HPI  45 year old male with diabetes presents  With pain continuing..   Seen on 9/12 Dr. C...thought possible diverticulitis.. Started on cipro and flagyl.  Urine clear.  Seen in ER 9/14 .Marland Kitchen CT abd pelvis: normal   Seen 9/17 Dr. Reece Agar:  Felt story seems more consistent with kidney stone, however no hematuria, CT negative, blood work overall normal.  Will regardless treat as stone with strainer (provided today), flomax and naprosyn, percocets for breakthrough pain.  Update if not improving as expected or any worsening.  ?coming from spine, vs prostatitis but UA normal.  Stop flagyl, continue cipro for now.   constipation sxs, will treat with miralax. passing gas, appetite and PO intake down.... This started after started pain med.  Seen again 9/24 Dr. Reece Agar Overall improved, but residual numbness left inguinal region.  Advised finish cipro (1-2 days left) to treat possible prostatitis, use naprosyn as needed for possible kidney stone (although never hematuria).  Given pain improved, cleared to return to work.  No hernia on exam today. ?coming from lower back although no back pain, not consistent with dermatomal distribution.  To update [place] if not improving as expected or any worsening. Consider back x-ray vs imaging.   Today:  Pain is less abdominal pain or flank pain... Has moved down more to left sided low back radiated to left hip.  50% improvement from ER visit. No pain radiating down leg. Numbness on left side worse in last few days. No weakness.  Feel pain worse when walking and leaning  forward  {PAin on 6/7 on pain scale Has noted some difficulty in urine flow. No further nausea, constipation improved. Pain dose feel like in him "core of him"  Oxycodone constipated him.. Now using naprosyn.  Did have back strain from lifting 1-2 months ago.. Improved with NSAIDs in a few  days.  Review of Systems  Constitutional: Negative for fever.  HENT: Negative for ear pain and neck pain.   Eyes: Negative for pain.  Respiratory: Negative for shortness of breath and wheezing.   Cardiovascular: Negative for chest pain.  Gastrointestinal: Positive for nausea, abdominal pain and constipation. Negative for vomiting, diarrhea, blood in stool and abdominal distention.  Genitourinary: Positive for frequency, flank pain and difficulty urinating. Negative for dysuria, hematuria, discharge, penile swelling and penile pain.  Musculoskeletal: Positive for back pain. Negative for gait problem.       Objective:   Physical Exam  Constitutional: He appears well-developed and well-nourished.  Neck: Normal range of motion. Neck supple.  Cardiovascular: Normal rate, regular rhythm, normal heart sounds and intact distal pulses.  Exam reveals no gallop and no friction rub.   No murmur heard. Pulmonary/Chest: Effort normal and breath sounds normal. No respiratory distress. He has no wheezes. He has no rales. He exhibits no tenderness.  Abdominal: Soft. He exhibits no distension and no mass. There is no hepatosplenomegaly. There is tenderness in the left lower quadrant. There is no rebound, no guarding and no CVA tenderness.  Musculoskeletal:       Lumbar back: Normal.       No vertebral ttp, left paraspinous muscle ttp Neg SLR  numbness over left lateral thigh and hip, mildly positive Faber's          Assessment & Plan:

## 2011-08-12 ENCOUNTER — Telehealth: Payer: Self-pay | Admitting: *Deleted

## 2011-08-12 DIAGNOSIS — M549 Dorsalgia, unspecified: Secondary | ICD-10-CM

## 2011-08-12 DIAGNOSIS — R200 Anesthesia of skin: Secondary | ICD-10-CM

## 2011-08-12 NOTE — Telephone Encounter (Signed)
Patient was seen last week for left side pain and was told to call back today if not any better, he is not.  Still has numbness. Please advise on what to do next.

## 2011-08-12 NOTE — Telephone Encounter (Signed)
Notify pt that we will move ahead with MRi lumbar spine.

## 2011-08-16 ENCOUNTER — Ambulatory Visit
Admission: RE | Admit: 2011-08-16 | Discharge: 2011-08-16 | Disposition: A | Discharge status: Home / Self Care | Payer: BC Managed Care – PPO | Source: Ambulatory Visit | Attending: Family Medicine | Admitting: Family Medicine

## 2011-08-16 DIAGNOSIS — M549 Dorsalgia, unspecified: Secondary | ICD-10-CM

## 2011-08-16 DIAGNOSIS — R200 Anesthesia of skin: Secondary | ICD-10-CM

## 2011-08-22 ENCOUNTER — Telehealth: Payer: Self-pay | Admitting: Family Medicine

## 2011-08-22 NOTE — Telephone Encounter (Signed)
Message copied by Excell Seltzer on Thu Aug 22, 2011 12:39 PM ------      Message from: [place], [place] E      Created: Mon Aug 19, 2011 12:10 PM       Will call pt to discuss results on Thursday upon my return.

## 2011-08-22 NOTE — Telephone Encounter (Signed)
Discucssed results of MRI in detail with pt... Does show small disc protrusion that may be at least a component of symptoms. He has continued to improve... With minimal symptoms now... excpet for numbness.   We will take pathway of limited intervention at this point...definately no surgical indication. NSAIDs (naprosyn prn), gentle stretching, heat. Complete cipro given only 2 more days to cover what may have been a prostatis.  Pt voiced understanding. Weill let me know if not continuing to improve for referral to PMR for ? Injection.

## 2011-09-02 ENCOUNTER — Other Ambulatory Visit (INDEPENDENT_AMBULATORY_CARE_PROVIDER_SITE_OTHER): Payer: BC Managed Care – PPO

## 2011-09-02 DIAGNOSIS — E119 Type 2 diabetes mellitus without complications: Secondary | ICD-10-CM

## 2011-09-02 LAB — HEMOGLOBIN A1C: Hgb A1c MFr Bld: 6.5 % (ref 4.6–6.5)

## 2011-09-02 LAB — LIPID PANEL: Cholesterol: 173 mg/dL (ref 0–200)

## 2011-09-09 ENCOUNTER — Ambulatory Visit (INDEPENDENT_AMBULATORY_CARE_PROVIDER_SITE_OTHER): Payer: BC Managed Care – PPO | Admitting: Family Medicine

## 2011-09-09 ENCOUNTER — Encounter: Payer: Self-pay | Admitting: Family Medicine

## 2011-09-09 VITALS — BP 120/70 | HR 68 | Temp 98.3°F | Ht 77.0 in | Wt 229.1 lb

## 2011-09-09 DIAGNOSIS — M549 Dorsalgia, unspecified: Secondary | ICD-10-CM

## 2011-09-09 DIAGNOSIS — Z23 Encounter for immunization: Secondary | ICD-10-CM

## 2011-09-09 DIAGNOSIS — E785 Hyperlipidemia, unspecified: Secondary | ICD-10-CM

## 2011-09-09 DIAGNOSIS — E119 Type 2 diabetes mellitus without complications: Secondary | ICD-10-CM

## 2011-09-09 NOTE — Progress Notes (Signed)
  Subjective:    Patient ID: Brandon Walls, male    DOB: Jul 03, 1966, 45 y.o.   MRN: [id number]  HPI  Diabetes: Much improved and now at goal on glucophage and with lifestyle changes.  Has completed nutrition referral.  Using medications without difficulties:Feels somewhat more gassy. Hypoglycemic episodes:None Hyperglycemic episodes:None Feet problems:None Blood Sugars averaging:  FBS:110 eye exam within last year: yes  BP at goal <130/90.    Elevated Cholesterol: Improved control but not at goal on NO med  Diet compliance: Great Exercise: Limited due to back pain, but getting back to it now. Other complaints: Back pain continuing to improve ( had MRI showing lumbar herniation, no surgical indication... Only symptom now is numbness)   Review of Systems  Constitutional: Negative for fever and fatigue.  HENT: Negative for ear pain.   Respiratory: Negative for cough, shortness of breath and wheezing.   Cardiovascular: Negative for chest pain, palpitations and leg swelling.  Gastrointestinal: Negative for nausea, abdominal pain, diarrhea and constipation.       Objective:   Physical Exam  Diabetic foot exam: Normal inspection No skin breakdown No calluses  Normal DP pulses Normal sensation to light touch and monofilament Nails normal       Assessment & Plan:

## 2011-09-09 NOTE — Patient Instructions (Signed)
Continue great work on diet changes.Marland Kitchen Restart exercise.

## 2011-09-09 NOTE — Assessment & Plan Note (Signed)
Info on chol diet given. Continue working on this. Re-check in 3 months... Consider med if not at goal at that time.

## 2011-09-09 NOTE — Assessment & Plan Note (Signed)
Great improvement with lifestyle changes and glucophage. Pt wishes to stop glucophage as soon as able... We will continue med for another 3 months to assure stability. If A1C same or lower, will try trial off glucophage.

## 2011-09-09 NOTE — Progress Notes (Signed)
  Subjective:    Patient ID: Brandon Walls, male    DOB: [birth year], 45 y.o.   MRN: [id number]  HPI    Review of Systems     Objective:   Physical Exam  Constitutional: Vital signs are normal. He appears well-developed and well-nourished.  HENT:  Head: Normocephalic.  Right Ear: Hearing normal.  Left Ear: Hearing normal.  Nose: Nose normal.  Mouth/Throat: Oropharynx is clear and moist and mucous membranes are normal.  Neck: Trachea normal. Carotid bruit is not present. No mass and no thyromegaly present.  Cardiovascular: Normal rate, regular rhythm and normal pulses.  Exam reveals no gallop, no distant heart sounds and no friction rub.   No murmur heard.      No peripheral edema  Pulmonary/Chest: Effort normal and breath sounds normal. No respiratory distress.  Skin: Skin is warm, dry and intact. No rash noted.  Psychiatric: He has a normal mood and affect. His speech is normal and behavior is normal. Thought content normal.          Assessment & Plan:

## 2011-09-09 NOTE — Assessment & Plan Note (Signed)
Pain resolved, continued numbness.. Pt not interested in referral at this time.

## 2011-11-25 ENCOUNTER — Other Ambulatory Visit: Payer: BC Managed Care – PPO

## 2011-11-27 ENCOUNTER — Other Ambulatory Visit (INDEPENDENT_AMBULATORY_CARE_PROVIDER_SITE_OTHER): Payer: BC Managed Care – PPO

## 2011-11-27 DIAGNOSIS — E785 Hyperlipidemia, unspecified: Secondary | ICD-10-CM

## 2011-11-27 DIAGNOSIS — E119 Type 2 diabetes mellitus without complications: Secondary | ICD-10-CM

## 2011-11-27 LAB — COMPREHENSIVE METABOLIC PANEL
ALT: 24 U/L (ref 0–53)
Albumin: 4.3 g/dL (ref 3.5–5.2)
Alkaline Phosphatase: 41 U/L (ref 39–117)
CO2: 28 mEq/L (ref 19–32)
GFR: 93.63 mL/min (ref >60.00)
Glucose, Bld: 115 mg/dL — ABNORMAL HIGH (ref 70–99)
Potassium: 4.3 mEq/L (ref 3.5–5.1)
Sodium: 140 mEq/L (ref 135–145)
Total Protein: 7.2 g/dL (ref 6.0–8.3)

## 2011-11-27 LAB — LIPID PANEL
Cholesterol: 187 mg/dL (ref 0–200)
LDL Cholesterol: 138 mg/dL — ABNORMAL HIGH (ref 0–99)

## 2011-12-02 ENCOUNTER — Encounter: Payer: Self-pay | Admitting: Family Medicine

## 2011-12-02 ENCOUNTER — Ambulatory Visit (INDEPENDENT_AMBULATORY_CARE_PROVIDER_SITE_OTHER): Payer: BC Managed Care – PPO | Admitting: Family Medicine

## 2011-12-02 VITALS — BP 130/70 | HR 64 | Temp 97.9°F | Ht 77.5 in | Wt 237.4 lb

## 2011-12-02 DIAGNOSIS — Z23 Encounter for immunization: Secondary | ICD-10-CM

## 2011-12-02 DIAGNOSIS — E119 Type 2 diabetes mellitus without complications: Secondary | ICD-10-CM

## 2011-12-02 DIAGNOSIS — E785 Hyperlipidemia, unspecified: Secondary | ICD-10-CM

## 2011-12-02 DIAGNOSIS — Z0000 Encounter for general adult medical examination without abnormal findings: Secondary | ICD-10-CM

## 2011-12-02 DIAGNOSIS — Z125 Encounter for screening for malignant neoplasm of prostate: Secondary | ICD-10-CM

## 2011-12-02 DIAGNOSIS — R143 Flatulence: Secondary | ICD-10-CM

## 2011-12-02 LAB — HM DIABETES FOOT EXAM

## 2011-12-02 NOTE — Progress Notes (Signed)
Subjective:    Patient ID: Brandon Walls, male    DOB: November 13, 1965, 46 y.o.   MRN: [id number]  HPI  The patient is here for annual wellness exam and preventative care.    Diabetes:  Well controlled on no medication. Lab Results  Component Value Date   HGBA1C 5.9 11/27/2011   Hypoglycemic episodes:None Hyperglycemic episodes:None Feet problems:None Blood Sugars averaging: FBS daily: 103-106 eye exam within last year: BP at goal <130/80.  Elevated Cholesterol:LDL not at goal on no medication   Diet compliance: Good,with sweet, but not sticking to chol diet.  Exercise:4 times a week.  Other complaints:     Review of Systems  Constitutional: Negative for fever, fatigue and unexpected weight change.  HENT: Negative for ear pain, congestion, sore throat, rhinorrhea, trouble swallowing and postnasal drip.   Eyes: Negative for pain.  Respiratory: Negative for cough, shortness of breath and wheezing.   Cardiovascular: Negative for chest pain, palpitations and leg swelling.  Gastrointestinal: Negative for nausea, abdominal pain, diarrhea, constipation, blood in stool, abdominal distention and rectal pain.       Does have increase in gas in past 6 months to a year.  Genitourinary: Negative for dysuria, urgency, hematuria, discharge, penile swelling, scrotal swelling, difficulty urinating, penile pain and testicular pain.  Musculoskeletal: Positive for back pain and arthralgias. Negative for gait problem.  Skin: Negative for rash.  Neurological: Negative for syncope, weakness, light-headedness, numbness and headaches.  Psychiatric/Behavioral: Negative for behavioral problems and dysphoric mood. The patient is not nervous/anxious.        Objective:   Physical Exam  Constitutional: He appears well-developed and well-nourished.  Non-toxic appearance. He does not appear ill. No distress.  HENT:  Head: Normocephalic and atraumatic.  Right Ear: Hearing, tympanic membrane, external  ear and ear canal normal.  Left Ear: Hearing, tympanic membrane, external ear and ear canal normal.  Nose: Nose normal.  Mouth/Throat: Uvula is midline, oropharynx is clear and moist and mucous membranes are normal.  Eyes: Conjunctivae, EOM and lids are normal. Pupils are equal, round, and reactive to light. No foreign bodies found.  Neck: Trachea normal, normal range of motion and phonation normal. Neck supple. Carotid bruit is not present. No mass and no thyromegaly present.  Cardiovascular: Normal rate, regular rhythm, S1 normal, S2 normal, intact distal pulses and normal pulses.  Exam reveals no gallop.   No murmur heard. Pulmonary/Chest: Breath sounds normal. He has no wheezes. He has no rhonchi. He has no rales.  Abdominal: Soft. Normal appearance and bowel sounds are normal. There is no hepatosplenomegaly. There is no tenderness. There is no rebound, no guarding and no CVA tenderness. No hernia. Hernia confirmed negative in the right inguinal area and confirmed negative in the left inguinal area.  Genitourinary: Prostate normal, testes normal and penis normal. Rectal exam shows no external hemorrhoid, no internal hemorrhoid, no fissure, no mass, no tenderness and anal tone normal. Guaiac negative stool. Prostate is not enlarged and not tender. Right testis shows no mass and no tenderness. Left testis shows no mass and no tenderness. No paraphimosis or penile tenderness.  Lymphadenopathy:    He has no cervical adenopathy.       Right: No inguinal adenopathy present.       Left: No inguinal adenopathy present.  Neurological: He is alert. He has normal strength and normal reflexes. No cranial nerve deficit or sensory deficit. Gait normal.  Skin: Skin is warm, dry and intact. No rash noted.  Psychiatric: He  has a normal mood and affect. His speech is normal and behavior is normal. Judgment and thought content normal.    .Diabetic foot exam: Normal inspection No skin breakdown No calluses    Normal DP pulses Normal sensation to light touch and monofilament Nails normal       Assessment & Plan:  CPX: The patient's preventative maintenance and recommended screening tests for an annual wellness exam were reviewed in full today. Brought up to date unless services declined.  Counselled on the importance of diet, exercise, and its role in overall health and mortality. The patient's FH and SH was reviewed, including their home life, tobacco status, and drug and alcohol status.   Vaccines:Upodate with flu, tdap, will give PNA today. Prostate: Due for prostate exam and PSA nml 05/2011 given AA.

## 2011-12-02 NOTE — Assessment & Plan Note (Signed)
Likely due to diet changes.  Did have longterm antibiotic several months ago. Will have him start trial of probiotics. Call if not improving for further eval.

## 2011-12-02 NOTE — Assessment & Plan Note (Signed)
Well controlled on diet. Encouraged exercise, weight loss, healthy eating habits.  

## 2011-12-02 NOTE — Patient Instructions (Addendum)
Red yeast rice 2400 mg divided daily.  Return in 6 months for DM check with fasting labs prior.  Trial of probiotic.Marland Kitchen Start with align samples daily, can then use over the counter probiotic. Call if not improving in 3-4 weeks.

## 2011-12-02 NOTE — Assessment & Plan Note (Signed)
INadequate control. Start red yeast rice, diet info given. Recheck in 3-6 months.

## 2012-01-24 ENCOUNTER — Ambulatory Visit: Payer: BC Managed Care – PPO | Admitting: Family Medicine

## 2012-01-24 DIAGNOSIS — Z0289 Encounter for other administrative examinations: Secondary | ICD-10-CM

## 2012-02-05 ENCOUNTER — Encounter: Payer: Self-pay | Admitting: Family Medicine

## 2012-02-05 ENCOUNTER — Ambulatory Visit (INDEPENDENT_AMBULATORY_CARE_PROVIDER_SITE_OTHER): Payer: BC Managed Care – PPO | Admitting: Family Medicine

## 2012-02-05 VITALS — BP 114/80 | HR 70 | Temp 98.4°F | Wt 241.0 lb

## 2012-02-05 DIAGNOSIS — R141 Gas pain: Secondary | ICD-10-CM

## 2012-02-05 DIAGNOSIS — R079 Chest pain, unspecified: Secondary | ICD-10-CM | POA: Insufficient documentation

## 2012-02-05 DIAGNOSIS — R143 Flatulence: Secondary | ICD-10-CM

## 2012-02-05 MED ORDER — NAPROXEN 500 MG PO TABS: ORAL_TABLET | ORAL | Status: DC

## 2012-02-05 NOTE — Progress Notes (Signed)
  Subjective:    Patient ID: Brandon Walls, male    DOB: 09/08/1966, 46 y.o.   MRN: [id number]  HPI CC: chest pain  4d h/o intermittent chest pain.  Occurs worse when standing up first thing in am, as well as when first lays recumbent in bed at night.  Also noted when on treadmill for 5 min.  Also notices pain in anterior chest with flexion of neck.  Not sharp/stabbing.  Some dull ache/throbbing.  No tightness/pressure.  Reproducible with deep palpation.  H/o this in past, had normal stress test done 2012 [facility]).  Pain improved with burping.  Worsened by nothing.    No nausea/vomiting, diaphoresis, SOB, lightheaded with chest pain.  No pleurisy, no cough.  Points to left chest wall with description of pain.  H/o HLD.  Mother with mild MI at age 69s.  No smokers at home.  No h/o DM, HTN.  Prediabetes. Lab Results  Component Value Date   HGBA1C 5.9 11/27/2011   Lab Results  Component Value Date   CHOL 187 11/27/2011   HDL 34.50* 11/27/2011   LDLCALC 138* 11/27/2011   LDLDIRECT 133.6 02/25/2011   TRIG 74.0 11/27/2011   CHOLHDL 5 11/27/2011   Also would like ears checked as 2 wks ago had L earache that since resolved.  Review of Systems Per HPI    Objective:   Physical Exam  Nursing note and vitals reviewed. Constitutional: He appears well-developed and well-nourished. No distress.  HENT:  Head: Normocephalic and atraumatic.  Right Ear: Hearing, tympanic membrane, external ear and ear canal normal.  Left Ear: Hearing, tympanic membrane, external ear and ear canal normal.  Mouth/Throat: Oropharynx is clear and moist. No oropharyngeal exudate.  Eyes: Conjunctivae and EOM are normal. Pupils are equal, round, and reactive to light. No scleral icterus.  Neck: Normal range of motion. Neck supple. Carotid bruit is not present.  Cardiovascular: Normal rate, regular rhythm, normal heart sounds and intact distal pulses.   No murmur heard. Pulmonary/Chest: Effort normal and breath sounds  normal. No respiratory distress. He has no wheezes. He has no rales. He exhibits tenderness (left 3-4th costochondral junction tender to palpation).  Lymphadenopathy:    He has no cervical adenopathy.  Skin: Skin is warm and dry. No rash noted.  Psychiatric: He has a normal mood and affect.       Assessment & Plan:

## 2012-02-05 NOTE — Assessment & Plan Note (Signed)
Anticipate due to costochondritis given reproducible chest pain at left costochondral margin. Treat with NSAIDs, ice/heat. If not improving advised to notify us. In setting of HLD but normal ETT 11/2010 and normal EKG today. EKG - NSR rate 70, normal axis, intervals, no hypertrophy or ST/T changes

## 2012-02-05 NOTE — Patient Instructions (Signed)
I think you have costochondritis - treat with naprosyn twice daily for 5 days then as needed.  If not better let us know. EKG looking normal today. For gas - try gas X with meals and zantac for possible heartburn component  If not better let us know.  Costochondritis Costochondritis (Tietze syndrome), or costochondral separation, is a swelling and irritation (inflammation) of the tissue (cartilage) that connects your ribs with your breastbone (sternum). It may occur on its own (spontaneously), through damage caused by an accident (trauma), or simply from coughing or minor exercise. It may take up to 6 weeks to get better and longer if you are unable to be conservative in your activities. HOME CARE INSTRUCTIONS   Avoid exhausting physical activity. Try not to strain your ribs during normal activity. This would include any activities using chest, belly (abdominal), and side muscles, especially if heavy weights are used.   Use ice for 15 to 20 minutes per hour while awake for the first 2 days. Place the ice in a plastic bag, and place a towel between the bag of ice and your skin.   Only take over-the-counter or prescription medicines for pain, discomfort, or fever as directed by your caregiver.  SEEK IMMEDIATE MEDICAL CARE IF:   Your pain increases or you are very uncomfortable.   You have a fever.   You develop difficulty with your breathing.   You cough up blood.   You develop worse chest pains, shortness of breath, sweating, or vomiting.   You develop new, unexplained problems (symptoms).  MAKE SURE YOU:   Understand these instructions.   Will watch your condition.   Will get help right away if you are not doing well or get worse.  Document Released: 08/07/2005 Document Revised: 10/17/2011 Document Reviewed: 06/15/2008 [facility] Patient Information 2012 [place], [place].

## 2012-02-05 NOTE — Assessment & Plan Note (Signed)
rec trial of gas X with meals and zantac for any heartburn component. Update [place] if not improving as expected.

## 2012-06-01 ENCOUNTER — Other Ambulatory Visit: Payer: BC Managed Care – PPO

## 2012-06-01 ENCOUNTER — Ambulatory Visit (INDEPENDENT_AMBULATORY_CARE_PROVIDER_SITE_OTHER): Payer: BC Managed Care – PPO | Admitting: Family Medicine

## 2012-06-01 ENCOUNTER — Ambulatory Visit (INDEPENDENT_AMBULATORY_CARE_PROVIDER_SITE_OTHER)
Admission: RE | Admit: 2012-06-01 | Discharge: 2012-06-01 | Disposition: A | Discharge status: Home / Self Care | Payer: BC Managed Care – PPO | Source: Ambulatory Visit | Attending: Family Medicine | Admitting: Family Medicine

## 2012-06-01 ENCOUNTER — Encounter: Payer: Self-pay | Admitting: Family Medicine

## 2012-06-01 VITALS — BP 114/70 | HR 64 | Temp 98.0°F | Wt 242.0 lb

## 2012-06-01 DIAGNOSIS — M20019 Mallet finger of unspecified finger(s): Secondary | ICD-10-CM | POA: Insufficient documentation

## 2012-06-01 NOTE — Assessment & Plan Note (Addendum)
X-ray given 1/3 of mallet finger associated with avulsion fracture. No fracture noted but will have radiologist review. Will place finger in splint in neutral/slight extension  With plan to wear without flexion for 6 weeks. He will return in 1 week for possible splint change if dirty (pt told when during splint changes finger needs to not flex) and to see sports med doctor to make sure no further recommendations.

## 2012-06-01 NOTE — Progress Notes (Signed)
  Subjective:    Patient ID: Brandon Walls, male    DOB: [birth year], 46 y.o.   MRN: [id number]  HPI  46 year old male presents to clinic today following bowling injury  with change in left 5th finger.  he reports it occurred 2 days ago when he took his fingers out of ball... Was unable to straighten distal portion of pinky.   No pain except when he presses on it. No redness, mild swelling.   He taped it up.  Prior to this no finger issues.   Review of Systems  HENT: Negative for congestion.   Eyes: Negative for pain.  Respiratory: Negative for shortness of breath.   Cardiovascular: Negative for chest pain.  Gastrointestinal: Negative for abdominal pain.  Skin: Negative for rash and wound.       Objective:   Physical Exam  Constitutional: He is oriented to person, place, and time. He appears well-developed and well-nourished.  HENT:  Head: Normocephalic.  Neck: Normal range of motion.  Cardiovascular: Normal rate and regular rhythm.   Pulmonary/Chest: Effort normal.  Musculoskeletal:       Right hand: Normal.       Left hand: Decreased strength noted.       5th DIP tenderness dorsally, when isolated DIP no movement in extension, flexion full. Able to passively extend DIP fully.  PIP full active and passive motion.   Neurological: He is alert and oriented to person, place, and time. No cranial nerve deficit.  Skin: Skin is warm and dry. No rash noted.  Psychiatric: He has a normal mood and affect.          Assessment & Plan:

## 2012-06-01 NOTE — Patient Instructions (Addendum)
Keep finger in splint .Marland Kitchen For next 6 weeks. Follow up in 1-2 weeks for splint change ... Appt with Dr. Patsy Lager at that time. DO NOT ALLOW FINGER  TIP TO BEND. If changing splint at home...keep finger straight during splint change.

## 2012-06-08 ENCOUNTER — Other Ambulatory Visit: Payer: BC Managed Care – PPO

## 2012-06-09 ENCOUNTER — Ambulatory Visit: Payer: BC Managed Care – PPO | Admitting: Family Medicine

## 2012-07-07 ENCOUNTER — Encounter: Payer: Self-pay | Admitting: Family Medicine

## 2012-07-07 ENCOUNTER — Ambulatory Visit (INDEPENDENT_AMBULATORY_CARE_PROVIDER_SITE_OTHER): Payer: BC Managed Care – PPO | Admitting: Family Medicine

## 2012-07-07 VITALS — BP 122/80 | HR 68 | Temp 98.0°F | Wt 242.0 lb

## 2012-07-07 DIAGNOSIS — M6740 Ganglion, unspecified site: Secondary | ICD-10-CM | POA: Insufficient documentation

## 2012-07-07 DIAGNOSIS — M20019 Mallet finger of unspecified finger(s): Secondary | ICD-10-CM

## 2012-07-07 NOTE — Progress Notes (Signed)
  Subjective:    Patient ID: Brandon Walls, male    DOB: [birth year], 46 y.o.   MRN: [id number]  HPI  46 year old male with DM presents with painless mass on left medial ankle. First noted the area 6 months ago.. Small lump was present , gradually increasing in size over past few months.  No redness. No joint pain.  No cysts otherwise. No known injury in past.   Mallet finger.. Seen initially 4 weeks ago. X-ray nml. Placed in splint. Per pt splint comes off every night ! 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  Per pt finger deformity remains.  Review of Systems  Constitutional: Negative for fatigue.  Respiratory: Negative for shortness of breath.   Cardiovascular: Negative for chest pain.       Objective:   Physical Exam  Constitutional: He appears well-developed and well-nourished.  Musculoskeletal:       Left ankle: He exhibits normal range of motion and no swelling. no tenderness. No lateral malleolus and no medial malleolus tenderness found.       Left foot: Normal.       Marble size soft mobile mass in left medial ankle posterior to medial malleolus, no limitation on ROM.          Assessment & Plan:

## 2012-07-07 NOTE — Patient Instructions (Addendum)
Make appt with Dr. Patsy Lager  In next few days to discuss care of mallet finger. I am concerned as splint keeps coming off at night. Do not remove to wash hands, wear gloves instead.  Ankle lesion is likely a ganglian cyst... No treatment necessary unless painful of limiting function/motion.

## 2012-07-07 NOTE — Assessment & Plan Note (Signed)
Non tender and no limitation of function. Follow. No treatment necessary at this time.

## 2012-07-07 NOTE — Assessment & Plan Note (Addendum)
Pt did not let me know splint falling off at night until now...likely undoing all the good of wearing splint by having it fall off and removing it to wash hands. Need to not remove splint without holding finger in extension was reviewed in detail at first OV.  Will have pt see Dr. Patsy Lager for recommendations on better splint that will no come off despite taping at night. Also pt instructed for best results do not take off splint to wash hands.

## 2012-07-08 ENCOUNTER — Ambulatory Visit: Payer: BC Managed Care – PPO | Admitting: Family Medicine

## 2012-11-30 ENCOUNTER — Other Ambulatory Visit (INDEPENDENT_AMBULATORY_CARE_PROVIDER_SITE_OTHER): Payer: BC Managed Care – PPO

## 2012-11-30 DIAGNOSIS — E785 Hyperlipidemia, unspecified: Secondary | ICD-10-CM

## 2012-11-30 DIAGNOSIS — Z125 Encounter for screening for malignant neoplasm of prostate: Secondary | ICD-10-CM

## 2012-11-30 DIAGNOSIS — E119 Type 2 diabetes mellitus without complications: Secondary | ICD-10-CM

## 2012-11-30 LAB — LIPID PANEL
HDL: 30.8 mg/dL — ABNORMAL LOW (ref >39.00)
LDL Cholesterol: 126 mg/dL — ABNORMAL HIGH (ref 0–99)
Total CHOL/HDL Ratio: 6
VLDL: 21.6 mg/dL (ref 0.0–40.0)

## 2012-11-30 LAB — HEMOGLOBIN A1C: Hgb A1c MFr Bld: 6.3 % (ref 4.6–6.5)

## 2012-11-30 LAB — COMPREHENSIVE METABOLIC PANEL
ALT: 30 U/L (ref 0–53)
AST: 21 U/L (ref 0–37)
CO2: 29 mEq/L (ref 19–32)
GFR: 85.06 mL/min (ref >60.00)
Sodium: 139 mEq/L (ref 135–145)
Total Bilirubin: 1.1 mg/dL (ref 0.3–1.2)
Total Protein: 7.1 g/dL (ref 6.0–8.3)

## 2012-11-30 LAB — PSA: PSA: 0.97 ng/mL (ref 0.10–4.00)

## 2012-12-08 ENCOUNTER — Encounter: Payer: Self-pay | Admitting: Family Medicine

## 2012-12-08 ENCOUNTER — Ambulatory Visit (INDEPENDENT_AMBULATORY_CARE_PROVIDER_SITE_OTHER): Payer: BC Managed Care – PPO | Admitting: Family Medicine

## 2012-12-08 VITALS — BP 124/80 | HR 78 | Temp 98.0°F | Ht 77.0 in | Wt 244.0 lb

## 2012-12-08 DIAGNOSIS — M549 Dorsalgia, unspecified: Secondary | ICD-10-CM

## 2012-12-08 DIAGNOSIS — E119 Type 2 diabetes mellitus without complications: Secondary | ICD-10-CM

## 2012-12-08 DIAGNOSIS — Z0000 Encounter for general adult medical examination without abnormal findings: Secondary | ICD-10-CM

## 2012-12-08 DIAGNOSIS — Z23 Encounter for immunization: Secondary | ICD-10-CM

## 2012-12-08 DIAGNOSIS — E785 Hyperlipidemia, unspecified: Secondary | ICD-10-CM

## 2012-12-08 MED ORDER — DICLOFENAC SODIUM 75 MG PO TBEC: 75.0000 mg | DELAYED_RELEASE_TABLET | Freq: Two times a day (BID) | ORAL | Status: DC

## 2012-12-08 NOTE — Progress Notes (Signed)
The patient is here for annual wellness exam and preventative care.   Has been having left low back pain 02/13/09, radiates to left hip. No radiation of pain down leg. No numbness, no weakness, no fever. In 07/2011 Had similar pain, but more left abd pain.. nml Abd CT, UA clear. 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  Pain continued.. But changed to more low back pain.   MRI lumbar: IMPRESSION:  1. 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  The extrusion extends into the left neural foramen, producing left  foraminal stenosis potentially affecting the left L1 nerve.  2. 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  Treated with NSAIDs, heat and continued antibiotics. Pain resolved , only numbness remained.   No known injury  Works as a Curator.   Diabetes: Well controlled on no medication.  Lab Results  Component Value Date   HGBA1C 6.3 11/30/2012  Hypoglycemic episodes:None  Hyperglycemic episodes:None  Feet problems:None  Blood Sugars averaging: FBS daily: 110-120 Eye exam within last year:  None BP at goal <130/80.   Elevated Cholesterol:LDL not at goal on no medication  Lab Results  Component Value Date   CHOL 178 11/30/2012   HDL 30.80* 11/30/2012   LDLCALC 126* 11/30/2012   LDLDIRECT 133.6 02/25/2011   TRIG 108.0 11/30/2012   CHOLHDL 6 11/30/2012    Diet compliance: Good,with sweet, but not sticking to chol diet.  Exercise:Off and on. Other complaints:   Review of Systems  Constitutional: Negative for fever, fatigue and unexpected weight change.  HENT: Negative for ear pain, congestion, sore throat, rhinorrhea, trouble swallowing and postnasal drip.  Eyes: Negative for pain.  Respiratory: Negative for cough, shortness of breath and wheezing.  Cardiovascular: Negative for chest pain, palpitations and leg swelling.  Gastrointestinal: Negative for nausea, abdominal pain, diarrhea, constipation, blood in stool, abdominal  distention and rectal pain.   Genitourinary: Negative for dysuria, urgency, hematuria, discharge, penile swelling, scrotal swelling, difficulty urinating, penile pain and testicular pain.  Musculoskeletal: Negative for gait problem.  Skin: Negative for rash.  Neurological: Negative for syncope, weakness, light-headedness, numbness and headaches.  Psychiatric/Behavioral: Negative for behavioral problems and dysphoric mood. The patient is not nervous/anxious.    Objective:   Physical Exam  Constitutional: He appears well-developed and well-nourished. Non-toxic appearance. He does not appear ill. No distress.  HENT:  Head: Normocephalic and atraumatic.  Right Ear: Hearing, tympanic membrane, external ear and ear canal normal.  Left Ear: Hearing, tympanic membrane, external ear and ear canal normal.  Nose: Nose normal.  Mouth/Throat: Uvula is midline, oropharynx is clear and moist and mucous membranes are normal.  Eyes: Conjunctivae, EOM and lids are normal. Pupils are equal, round, and reactive to light. No foreign bodies found.  Neck: Trachea normal, normal range of motion and phonation normal. Neck supple. Carotid bruit is not present. No mass and no thyromegaly present.  Cardiovascular: Normal rate, regular rhythm, S1 normal, S2 normal, intact distal pulses and normal pulses. Exam reveals no gallop.  No murmur heard.  Pulmonary/Chest: Breath sounds normal. He has no wheezes. He has no rhonchi. He has no rales.  Abdominal: Soft. Normal appearance and bowel sounds are normal. There is no hepatosplenomegaly. There is no tenderness. There is no rebound, no guarding and no CVA tenderness. No hernia. Hernia confirmed negative in the right inguinal area and confirmed negative in the left inguinal area.  Genitourinary: Prostate normal, testes normal and penis normal. Rectal exam shows no external  hemorrhoid, no internal hemorrhoid, no fissure, no mass, no tenderness and anal tone normal. Guaiac  negative stool. Prostate is not enlarged and not tender. Right testis shows no mass and no tenderness. Left testis shows no mass and no tenderness. No paraphimosis or penile tenderness.  Lymphadenopathy:  He has no cervical adenopathy.  Right: No inguinal adenopathy present.  Left: No inguinal adenopathy present.  Neurological: He is alert. He has normal strength and normal reflexes. No cranial nerve deficit or sensory deficit. Gait normal.  Skin: Skin is warm, dry and intact. No rash noted.  Psychiatric: He has a normal mood and affect. His speech is normal and behavior is normal. Judgment and thought content normal.  MSK: ttp at upper low back, neg SLR, neg faber's. strength 5/5 B Lext, sensation intact.  .Diabetic foot exam:  Normal inspection  No skin breakdown  No calluses  Normal DP pulses  Normal sensation to light touch and monofilament  Nails normal   Assessment & Plan:   CPX: The patient's preventative maintenance and recommended screening tests for an annual wellness exam were reviewed in full today.  Brought up to date unless services declined.  Counselled on the importance of diet, exercise, and its role in overall health and mortality.  The patient's FH and SH was reviewed, including their home life, tobacco status, and drug and alcohol status.   Vaccines:Upodate with flu, tdap, PNA   Prostate: Due for prostate exam and PSA stable, following early given AA. Lab Results  Component Value Date   PSA 0.97 11/30/2012   PSA 0.75 05/29/2011  No family history of colon cancer.

## 2012-12-08 NOTE — Assessment & Plan Note (Signed)
Well controlled. With diet. Encouraged exercise, weight loss, healthy eating habits.

## 2012-12-08 NOTE — Assessment & Plan Note (Signed)
In adequate control.. LDL not at goal on no med. Start red yeast rice. Recheck in 3-6 months.

## 2012-12-08 NOTE — Patient Instructions (Addendum)
Start low back stretching exercise per sheet. Start diclofenac twice daily. Heat on low back. Call in 2 weeks if not improving for likely referral to PT and or physical medicine and rehab doctor for injection consideration.  Make sure to set up eye exam. Work on improving low cholesterol diet.  Get back on track on exercise. Consider red yeast rice 2400 mg divided daily.

## 2012-12-08 NOTE — Assessment & Plan Note (Signed)
Likely for L1 central stenosis and disc protrusion. Treat with heat, NSAIDs and stretches. Consider PT if not improving in 2 weeks. In past likely chronic prostatitis was incorrect diagnosis,... abd pain was likely die to pelvic distribution of L1 innervation.

## 2013-04-09 ENCOUNTER — Other Ambulatory Visit: Payer: Self-pay | Admitting: Family Medicine

## 2013-05-31 ENCOUNTER — Other Ambulatory Visit: Payer: BC Managed Care – PPO

## 2013-05-31 ENCOUNTER — Telehealth: Payer: Self-pay | Admitting: Family Medicine

## 2013-05-31 DIAGNOSIS — E119 Type 2 diabetes mellitus without complications: Secondary | ICD-10-CM

## 2013-05-31 DIAGNOSIS — E785 Hyperlipidemia, unspecified: Secondary | ICD-10-CM

## 2013-05-31 NOTE — Telephone Encounter (Signed)
Message copied by Excell Seltzer on Mon May 31, 2013  2:08 AM ------      Message from: [facility], [place] [facility]      Created: Tue May 25, 2013 10:56 AM      Regarding: Lab orders for 7.21.14       Labs for a 6 month f/u ------

## 2013-06-08 ENCOUNTER — Ambulatory Visit: Payer: BC Managed Care – PPO | Admitting: Family Medicine

## 2013-06-15 ENCOUNTER — Ambulatory Visit: Payer: BC Managed Care – PPO | Admitting: Family Medicine

## 2013-07-05 ENCOUNTER — Ambulatory Visit: Payer: BC Managed Care – PPO | Admitting: Family Medicine

## 2013-07-08 ENCOUNTER — Ambulatory Visit: Payer: BC Managed Care – PPO | Admitting: Family Medicine

## 2013-07-09 ENCOUNTER — Encounter: Payer: Self-pay | Admitting: Family Medicine

## 2013-07-09 ENCOUNTER — Ambulatory Visit (INDEPENDENT_AMBULATORY_CARE_PROVIDER_SITE_OTHER): Payer: BC Managed Care – PPO | Admitting: Family Medicine

## 2013-07-09 VITALS — BP 110/78 | HR 77 | Temp 98.2°F | Ht 77.0 in | Wt 240.5 lb

## 2013-07-09 DIAGNOSIS — M549 Dorsalgia, unspecified: Secondary | ICD-10-CM

## 2013-07-09 LAB — POCT URINALYSIS DIPSTICK
Bilirubin, UA: NEGATIVE
Blood, UA: NEGATIVE
Glucose, UA: NEGATIVE
Ketones, UA: NEGATIVE
Leukocytes, UA: NEGATIVE
Nitrite, UA: NEGATIVE
Protein, UA: NEGATIVE
Spec Grav, UA: 1.015
Urobilinogen, UA: 0.2
pH, UA: 6

## 2013-07-09 MED ORDER — CYCLOBENZAPRINE HCL 10 MG PO TABS: 10.0000 mg | ORAL_TABLET | Freq: Every evening | ORAL | Status: DC | PRN

## 2013-07-09 NOTE — Patient Instructions (Signed)
Start muscle relaxant at night, heat and gentle stretching per info packet. Call if not improving in 2 weeks or if new symptoms develop.

## 2013-07-09 NOTE — Progress Notes (Signed)
  Subjective:    Patient ID: Brandon Walls, male    DOB: 1966-02-19, 47 y.o.   MRN: [id number]  HPI  47 year old male with history of DM presents with new onset  Upper back pain x 5 days.Marland Kitchen HAs now moved lower down in back to mid back , more on right side than left. No known injury, just awoke with pain. Improves during the day. Worse with moving, better with stretching. Tender to touch, 5/10. No numbness or weakness in legs, maybe small amount of tingling in B feet ( he thought it was from boots at work given it felt better putting on tennis shoes), no fever, no dysuria. No urinary frequency.  He feels well otherwise.   Review of Systems  Constitutional: Negative for fever and fatigue.  HENT: Negative for ear pain.   Eyes: Negative for pain.  Respiratory: Negative for shortness of breath.   Cardiovascular: Negative for chest pain and leg swelling.       Objective:   Physical Exam  Constitutional: He appears well-developed and well-nourished.  Neck: Normal range of motion. Neck supple. No thyromegaly present.  Cardiovascular: Normal rate, regular rhythm and intact distal pulses.   No murmur heard. Pulmonary/Chest: Effort normal and breath sounds normal. No respiratory distress. He has no rales.  Abdominal: There is no CVA tenderness.  Musculoskeletal:       Thoracic back: He exhibits tenderness. He exhibits normal range of motion, no bony tenderness and no swelling.  ttp over right paraspinous muscle.  No current spasm.           Assessment & Plan:

## 2013-07-09 NOTE — Assessment & Plan Note (Signed)
UA clear. Most likely MSK strain. Treat with muscle relaxant, NSAID, massage, heat, info on stretching given. Follow up if not improving further.

## 2013-08-10 ENCOUNTER — Other Ambulatory Visit: Payer: Self-pay | Admitting: Family Medicine

## 2013-08-11 NOTE — Telephone Encounter (Signed)
Last seen for his diabetes 12/08/2012.  Ok to refill?

## 2013-08-12 NOTE — Telephone Encounter (Signed)
Needs follow up appt  ASAP refill until then.

## 2013-08-18 IMAGING — CT CT ABD-PELV W/O CM
2 of 4 series · 17 of 46 positions shown, 19 images · non-contrast
Comparison: None.

CLINICAL DATA: Left flank pain and left groin pain; nausea and
vomiting.

CT ABDOMEN AND PELVIS WITHOUT CONTRAST
TECHNIQUE: Multidetector CT imaging of the abdomen and pelvis was
performed following the standard protocol without intravenous
contrast.

[Series 2: a/p w/o 5.0 b31f st · axial · non-contrast · 0.78mm/px · z∈[+788,+1252]mm · 14 of 103 slices shown, 16 images]
[im 5/103  soft-tissue]
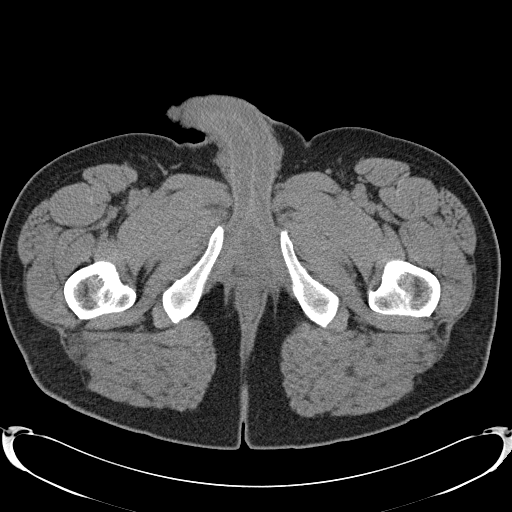
[im 5/103  bone]
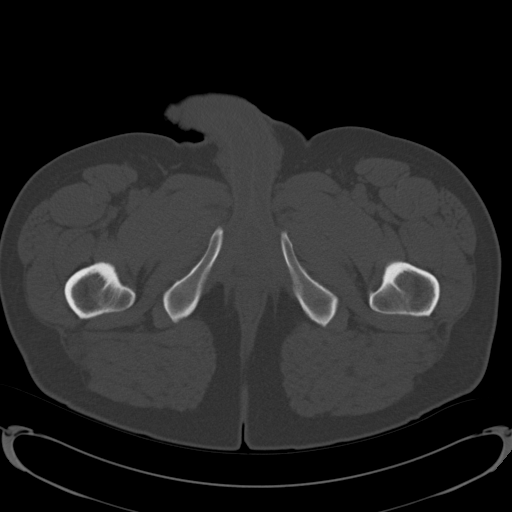
[im 13/103  soft-tissue]
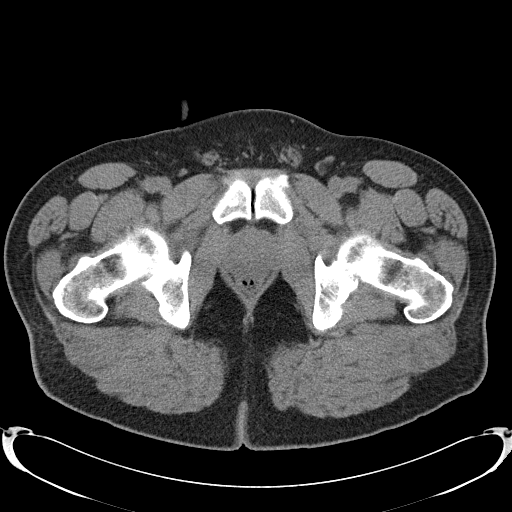
[im 21/103  soft-tissue]
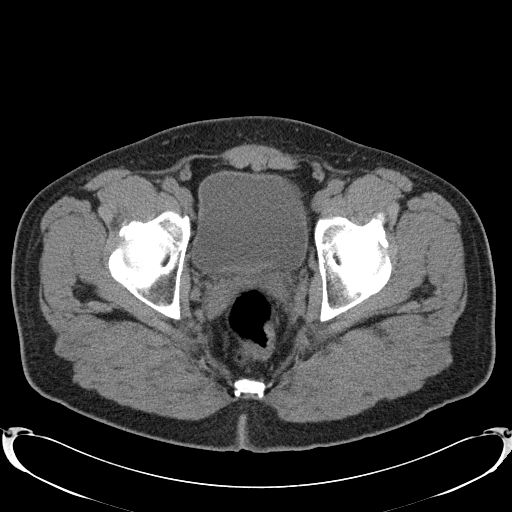
[im 29/103  soft-tissue]
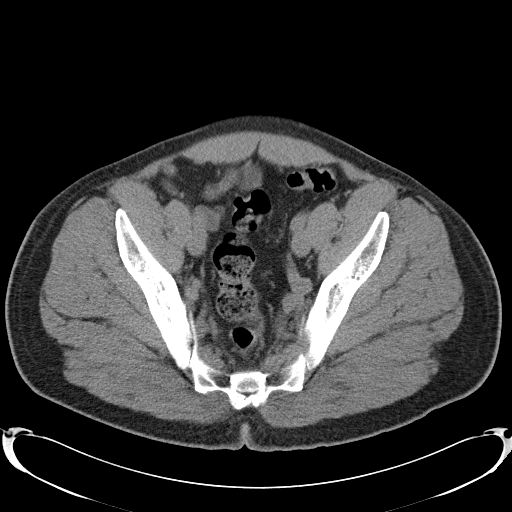
[im 33/103  soft-tissue]
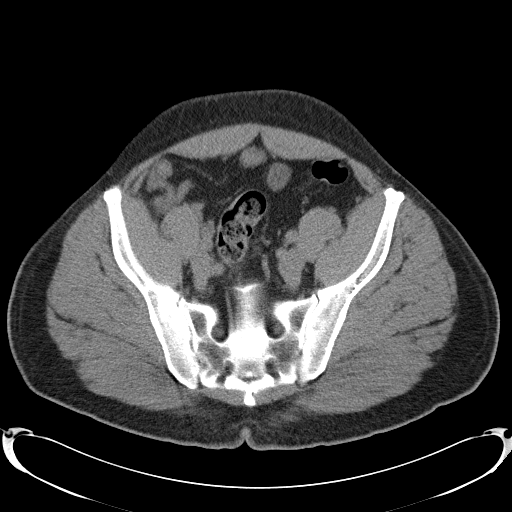
[im 41/103  soft-tissue]
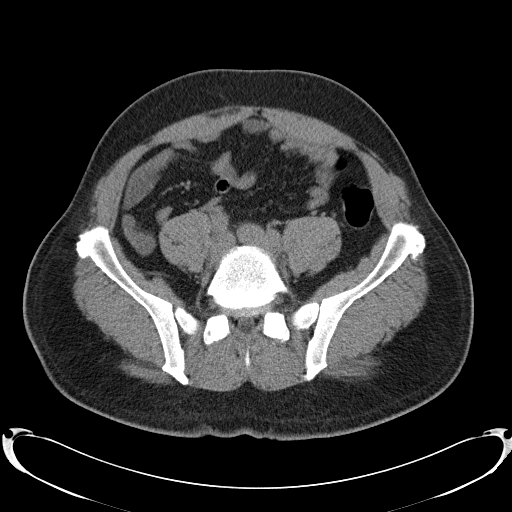
[im 49/103  soft-tissue]
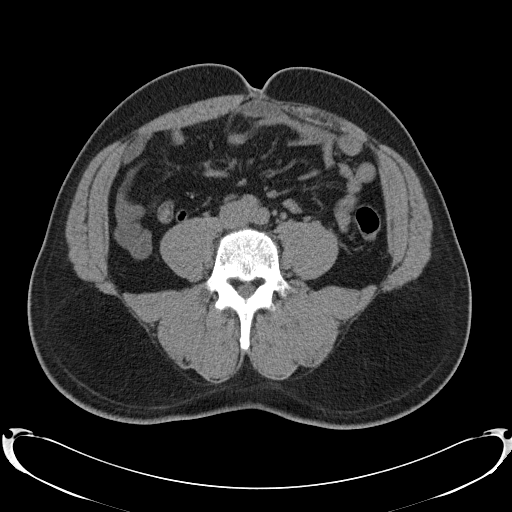
[im 54/103  soft-tissue]
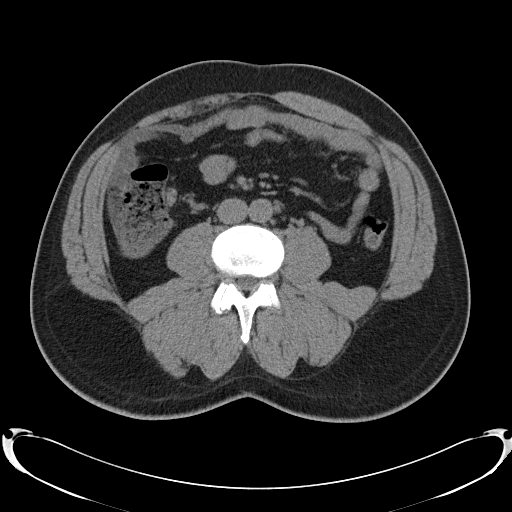
[im 62/103  soft-tissue]
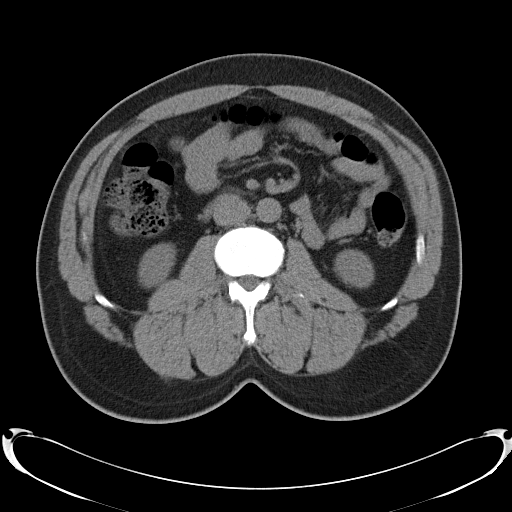
[im 62/103  bone]
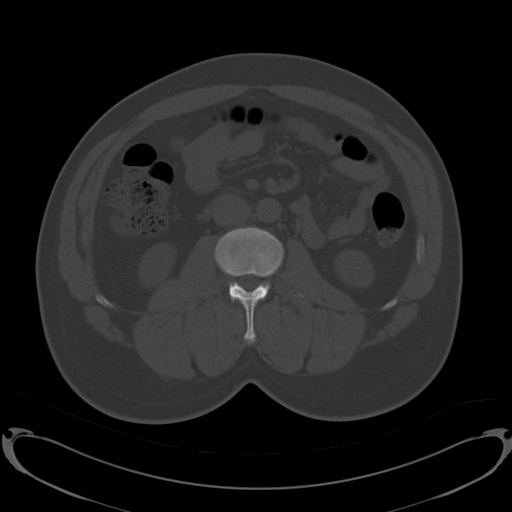
[im 70/103  soft-tissue]
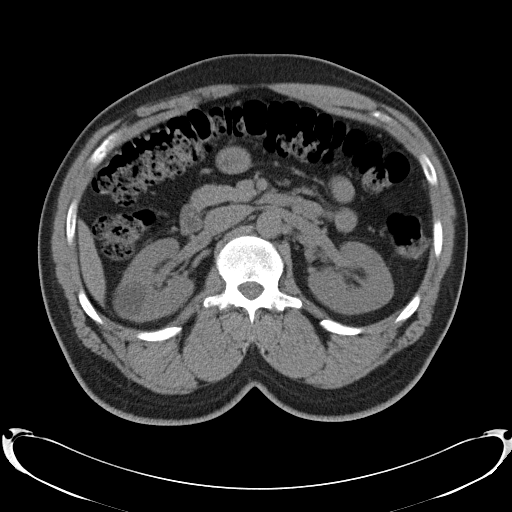
[im 78/103  soft-tissue]
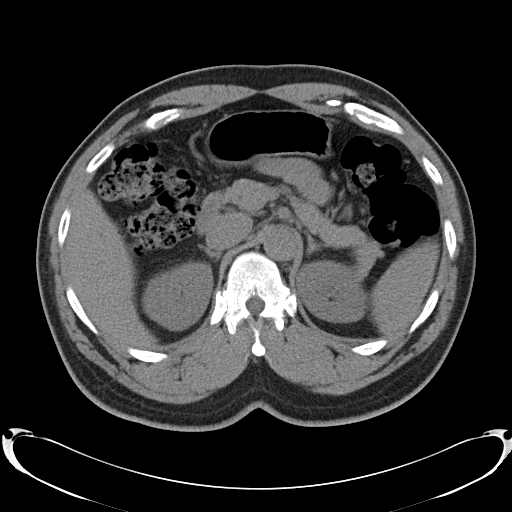
[im 82/103  soft-tissue]
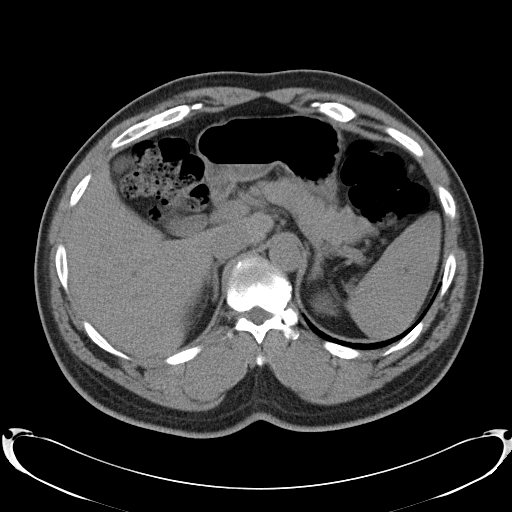
[im 90/103  soft-tissue]
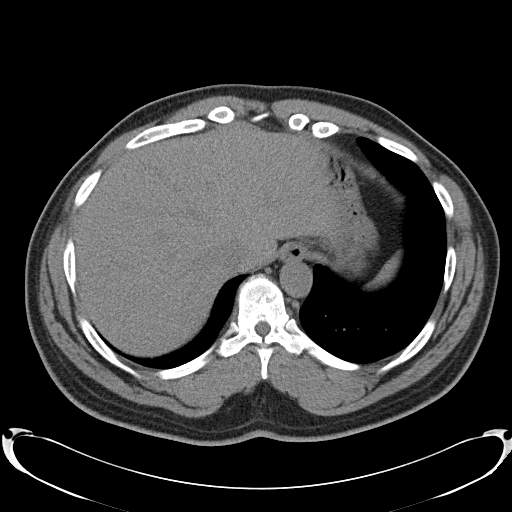
[im 98/103  soft-tissue]
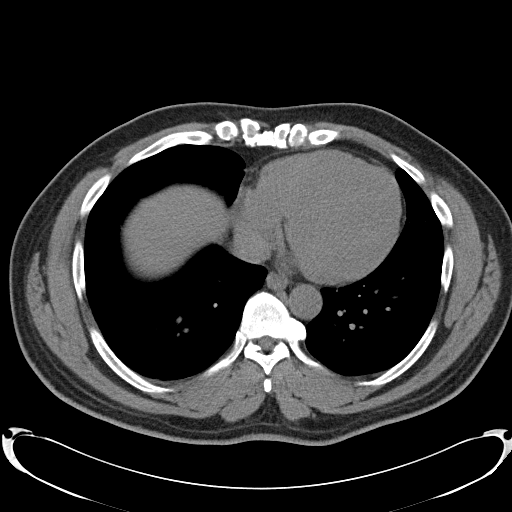

[Series 602: coronal · coronal · 1.01mm/px · 3 of 94 slices shown]
[im 32/94  soft-tissue]
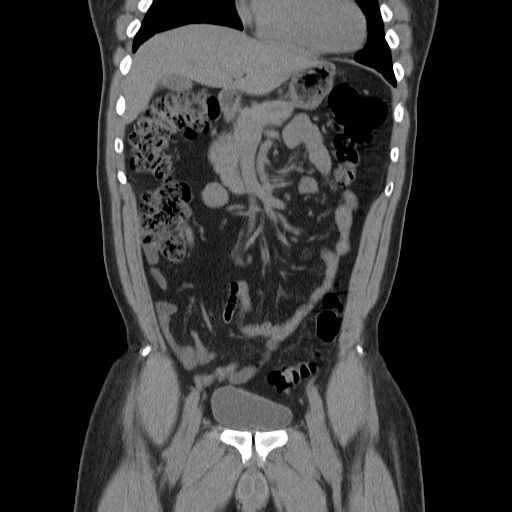
[im 42/94  soft-tissue]
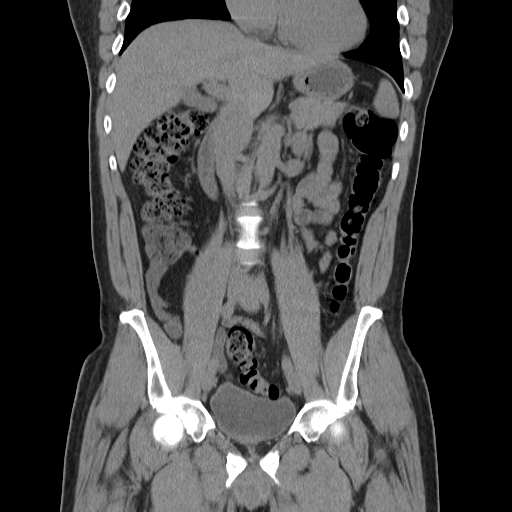
[im 52/94  soft-tissue]
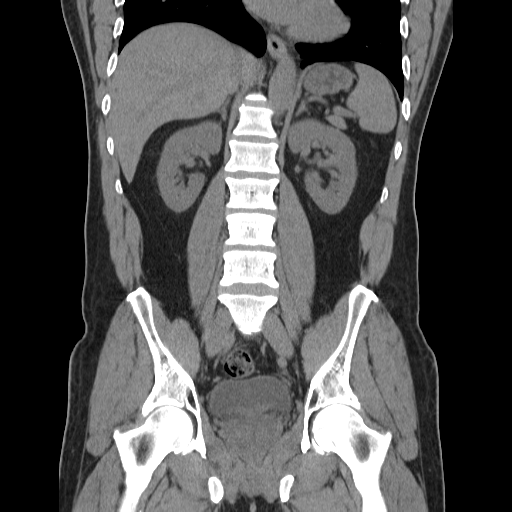

[17 of 46 positions shown; findings below may reference images not displayed]

FINDINGS: The visualized lung bases are clear.

Minimal nonspecific hypodensities within the liver may reflect
small cysts; the liver is otherwise unremarkable in appearance.
The spleen is normal in appearance.  The gallbladder is within
normal limits.  The pancreas and adrenal glands are unremarkable.

A 3.0 cm cyst is noted at the interpole region of the right kidney.
The kidneys are otherwise unremarkable in appearance.  There is no
evidence of hydronephrosis.  No renal or ureteral stones are seen.
No perinephric stranding is appreciated.

No free fluid is identified.  The small bowel is unremarkable in
appearance.  The stomach is within normal limits.  No acute
vascular abnormalities are seen.

The appendix is normal in caliber and contains air, without
evidence for appendicitis.  The colon is partially distended with
stool and is unremarkable in appearance.

The bladder is mildly distended and unremarkable in appearance; a
small urachal remnant is incidentally noted.  The prostate is
mildly enlarged, measuring 5.2 cm in transverse dimension.  No
inguinal lymphadenopathy is seen.

No acute osseous abnormalities are identified.  Mild vacuum
phenomenon is noted at the pubic symphysis.
IMPRESSION: 1.  No acute abnormalities identified in the abdomen or pelvis.
2.  Mildly enlarged prostate.
3.  Right renal cyst and possible tiny hepatic cysts noted.

## 2014-11-01 ENCOUNTER — Telehealth: Payer: Self-pay | Admitting: Family Medicine

## 2014-11-02 MED ORDER — METFORMIN HCL ER 500 MG PO TB24: ORAL_TABLET | ORAL | Status: DC

## 2014-11-02 NOTE — Telephone Encounter (Signed)
Cancelled request from [facility] and sent new refeill to [facility] as requested.  Needs appt for DM follow up ASAP.

## 2014-11-02 NOTE — Telephone Encounter (Signed)
Appointment for Diabetic follow up scheduled on 11/29/14. Please close.

## 2014-11-02 NOTE — Telephone Encounter (Signed)
Pts wife left v/m requesting refill of metformin to [facility]. Pt last sick visit 07/09/13, and last CPX 12/08/12. No future appt scheduled.Please advise.

## 2014-11-02 NOTE — Telephone Encounter (Signed)
Refill but pt needs appt for DM follow up ASAP.

## 2014-11-02 NOTE — Telephone Encounter (Signed)
Left message asking pt to call office  °

## 2014-11-02 NOTE — Telephone Encounter (Signed)
Please schedule patient for Diabetic Follow Up.

## 2014-11-29 ENCOUNTER — Ambulatory Visit: Payer: BC Managed Care – PPO | Admitting: Family Medicine

## 2014-11-29 ENCOUNTER — Telehealth: Payer: Self-pay | Admitting: Family Medicine

## 2014-11-29 NOTE — Telephone Encounter (Signed)
Patient did not come for their scheduled appointment today for diabetic follow up  Please let me know if the patient needs to be contacted immediately for follow up or if no follow up is necessary.

## 2014-11-29 NOTE — Telephone Encounter (Signed)
Please contact soon about needing to reschedule follow up diabetes. Not urgent.

## 2014-11-30 NOTE — Telephone Encounter (Signed)
Appointment 1/28 °Pt aware °

## 2014-12-08 ENCOUNTER — Encounter: Payer: Self-pay | Admitting: Family Medicine

## 2014-12-08 ENCOUNTER — Ambulatory Visit (INDEPENDENT_AMBULATORY_CARE_PROVIDER_SITE_OTHER): Payer: BLUE CROSS/BLUE SHIELD | Admitting: Family Medicine

## 2014-12-08 VITALS — BP 114/78 | HR 72 | Temp 98.2°F | Ht 76.0 in | Wt 240.5 lb

## 2014-12-08 DIAGNOSIS — E785 Hyperlipidemia, unspecified: Secondary | ICD-10-CM

## 2014-12-08 DIAGNOSIS — E119 Type 2 diabetes mellitus without complications: Secondary | ICD-10-CM

## 2014-12-08 DIAGNOSIS — Z0000 Encounter for general adult medical examination without abnormal findings: Secondary | ICD-10-CM

## 2014-12-08 DIAGNOSIS — Z125 Encounter for screening for malignant neoplasm of prostate: Secondary | ICD-10-CM

## 2014-12-08 LAB — HM DIABETES FOOT EXAM

## 2014-12-08 MED ORDER — ACCU-CHEK NANO SMARTVIEW W/DEVICE KIT: PACK | Status: DC

## 2014-12-08 NOTE — Progress Notes (Signed)
Subjective:    Patient ID: Brandon Walls, male    DOB: [birth year], 49 y.o.   MRN: [id number]  HPI 49 year old male presents for DM follow up. He has not had a visit since CPX in 11/2013. Will do that today as well.  Diabetes:  Due for re-eval. On metformin 1 tab 500 mg daily Lab Results  Component Value Date   HGBA1C 6.3 11/30/2012  Using medications without difficulties: Hypoglycemic episodes: Hyperglycemic episodes: Feet problems:none Blood Sugars averaging: Not checking, meter broken. eye exam within last year: due Has not been eating right lately... Had stopped taking meds. FBS 230  Restarted meds in last few weeks started back. FBS 118 before meter broken.  Now back to eating right and exercising 3 days a week.  Elevated Cholesterol: Due for re-eval. Goal < 100. Lab Results  Component Value Date   CHOL 178 11/30/2012   HDL 30.80* 11/30/2012   LDLCALC 126* 11/30/2012   LDLDIRECT 133.6 02/25/2011   TRIG 108.0 11/30/2012   CHOLHDL 6 11/30/2012  Using medications without problems:none Muscle aches: None Diet compliance:poor until recently. Exercise: 3 days a week. Other complaints:   Review of Systems  Constitutional: Negative for fever, fatigue and unexpected weight change.  HENT: Negative for congestion, ear pain, postnasal drip, rhinorrhea, sore throat and trouble swallowing.   Eyes: Negative for pain.  Respiratory: Negative for cough, shortness of breath and wheezing.   Cardiovascular: Negative for chest pain, palpitations and leg swelling.  Gastrointestinal: Negative for nausea, abdominal pain, diarrhea, constipation and blood in stool.  Genitourinary: Negative for dysuria, urgency, hematuria, discharge, penile swelling, scrotal swelling, difficulty urinating, penile pain and testicular pain.  Skin: Negative for rash.  Neurological: Negative for syncope, weakness, light-headedness, numbness and headaches.  Psychiatric/Behavioral: Negative for behavioral  problems and dysphoric mood. The patient is not nervous/anxious.        Objective:   Physical Exam  Constitutional: He appears well-developed and well-nourished.  Non-toxic appearance. He does not appear ill. No distress.  HENT:  Head: Normocephalic and atraumatic.  Right Ear: Hearing, tympanic membrane, external ear and ear canal normal.  Left Ear: Hearing, tympanic membrane, external ear and ear canal normal.  Nose: Nose normal.  Mouth/Throat: Uvula is midline, oropharynx is clear and moist and mucous membranes are normal.  Eyes: Conjunctivae, EOM and lids are normal. Pupils are equal, round, and reactive to light. Lids are everted and swept, no foreign bodies found.  Neck: Trachea normal, normal range of motion and phonation normal. Neck supple. Carotid bruit is not present. No thyroid mass and no thyromegaly present.  Cardiovascular: Normal rate, regular rhythm, S1 normal, S2 normal, intact distal pulses and normal pulses.  Exam reveals no gallop.   No murmur heard. Pulmonary/Chest: Breath sounds normal. He has no wheezes. He has no rhonchi. He has no rales.  Abdominal: Soft. Normal appearance and bowel sounds are normal. There is no hepatosplenomegaly. There is no tenderness. There is no rebound, no guarding and no CVA tenderness. No hernia. Hernia confirmed negative in the right inguinal area and confirmed negative in the left inguinal area.  Genitourinary: Prostate normal, testes normal and penis normal. Rectal exam shows no external hemorrhoid, no internal hemorrhoid, no fissure, no mass, no tenderness and anal tone normal. Guaiac negative stool. Prostate is not enlarged and not tender. Right testis shows no mass and no tenderness. Left testis shows no mass and no tenderness. No paraphimosis or penile tenderness.  Lymphadenopathy:    He  has no cervical adenopathy.       Right: No inguinal adenopathy present.       Left: No inguinal adenopathy present.  Neurological: He is alert. He  has normal strength and normal reflexes. No cranial nerve deficit or sensory deficit. Gait normal.  Skin: Skin is warm, dry and intact. No rash noted.  Psychiatric: He has a normal mood and affect. His speech is normal and behavior is normal. Judgment normal.   Diabetic foot exam: Normal inspection No skin breakdown No calluses  Normal DP pulses Normal sensation to light touch and monofilament Nails normal        Assessment & Plan:  The patient's preventative maintenance and recommended screening tests for an annual wellness exam were reviewed in full today. Brought up to date unless services declined.  Counselled on the importance of diet, exercise, and its role in overall health and mortality. The patient's FH and SH was reviewed, including their home life, tobacco status, and drug and alcohol status.   Mother with lymphoma. Vaccines:Upodate with flu, tdap, PNA  Prostate: Due for prostate exam and PSA stable, following early given AA. Lab Results  Component Value Date   PSA 0.97 11/30/2012   PSA 0.75 05/29/2011  No family history of colon cancer.

## 2014-12-08 NOTE — Assessment & Plan Note (Signed)
Due for re-eval. Now back on healthy DM lifestyle.. Improving.

## 2014-12-08 NOTE — Patient Instructions (Signed)
Return for fasting labs in next few days.

## 2014-12-08 NOTE — Progress Notes (Signed)
Pre visit review using our clinic review tool, if applicable. No additional management support is needed unless otherwise documented below in the visit note. 

## 2014-12-12 ENCOUNTER — Other Ambulatory Visit (INDEPENDENT_AMBULATORY_CARE_PROVIDER_SITE_OTHER): Payer: BLUE CROSS/BLUE SHIELD

## 2014-12-12 DIAGNOSIS — E119 Type 2 diabetes mellitus without complications: Secondary | ICD-10-CM

## 2014-12-12 DIAGNOSIS — Z125 Encounter for screening for malignant neoplasm of prostate: Secondary | ICD-10-CM

## 2014-12-12 DIAGNOSIS — E785 Hyperlipidemia, unspecified: Secondary | ICD-10-CM

## 2014-12-12 LAB — COMPREHENSIVE METABOLIC PANEL
ALBUMIN: 4.1 g/dL (ref 3.5–5.2)
ALK PHOS: 56 U/L (ref 39–117)
ALT: 19 U/L (ref 0–53)
AST: 16 U/L (ref 0–37)
BILIRUBIN TOTAL: 1.3 mg/dL — AB (ref 0.2–1.2)
BUN: 14 mg/dL (ref 6–23)
CALCIUM: 9 mg/dL (ref 8.4–10.5)
CO2: 29 mEq/L (ref 19–32)
Chloride: 105 mEq/L (ref 96–112)
Creatinine, Ser: 1.03 mg/dL (ref 0.40–1.50)
GFR: 98.66 mL/min (ref >60.00)
GLUCOSE: 164 mg/dL — AB (ref 70–99)
POTASSIUM: 4.2 meq/L (ref 3.5–5.1)
Sodium: 139 mEq/L (ref 135–145)
TOTAL PROTEIN: 6.5 g/dL (ref 6.0–8.3)

## 2014-12-12 LAB — LIPID PANEL
Cholesterol: 175 mg/dL (ref 0–200)
HDL: 29.8 mg/dL — ABNORMAL LOW (ref >39.00)
LDL CALC: 122 mg/dL — AB (ref 0–99)
NONHDL: 145.2
Total CHOL/HDL Ratio: 6
Triglycerides: 114 mg/dL (ref 0.0–149.0)
VLDL: 22.8 mg/dL (ref 0.0–40.0)

## 2014-12-12 LAB — HEMOGLOBIN A1C: Hgb A1c MFr Bld: 7.1 % — ABNORMAL HIGH (ref 4.6–6.5)

## 2014-12-12 LAB — PSA: PSA: 0.97 ng/mL (ref 0.10–4.00)

## 2015-01-12 LAB — HM DIABETES EYE EXAM

## 2015-02-06 ENCOUNTER — Ambulatory Visit (INDEPENDENT_AMBULATORY_CARE_PROVIDER_SITE_OTHER): Payer: BLUE CROSS/BLUE SHIELD | Admitting: Family Medicine

## 2015-02-06 ENCOUNTER — Encounter: Payer: Self-pay | Admitting: Family Medicine

## 2015-02-06 VITALS — BP 122/84 | HR 72 | Temp 98.3°F | Ht 76.0 in | Wt 236.4 lb

## 2015-02-06 DIAGNOSIS — G5692 Unspecified mononeuropathy of left upper limb: Secondary | ICD-10-CM

## 2015-02-06 MED ORDER — PREDNISONE 20 MG PO TABS: ORAL_TABLET | ORAL | Status: DC

## 2015-02-06 NOTE — Progress Notes (Signed)
Dr. Frederico Hamman T. Shakaya Bhullar, MD, [facility] [place], [address] Phone: [phone] Fax: [phone]  02/06/2015  Patient: Brandon Walls, MRN: [id number], DOB: [birth year], 49 y.o.  Primary Physician:  Eliezer Lofts, MD  Chief Complaint: Arm Pain  Subjective:   Veda Canning is a 49 y.o. very pleasant male patient who presents with the following:  3 days of left arm pain, radiating, then will go away and come back and go away.  No accident or injury.  The patient has had no specific trauma, fracture, or dislocation.  He is not having any significant pain in his neck, and he is having normal usual movement in his neck.  He has not had any trauma to the elbow, and he has not any injury or trauma to his hand or wrist.  He has not been doing anything or had any overuse injuries recently new or different compared to his baseline.  At his baseline, the patient is a Engineer, building services.  He subjectively thinks that his grip might be slightly altered or different compared to baseline, but only minimally so.  He is having some altered sensations down his entire arm without true description of radiculopathy.  His sensations will come and go relatively rapidly.  He is not having any chest pain, shortness of breath, or any inducible symptoms with exercise.  Engineer, building services - [employer].   Pain around the brachial nerve.  Does not feel like a muscle strain.  No brachial nerve pressure or pain.   Past Medical History, Surgical History, Social History, Family History, Problem List, Medications, and Allergies have been reviewed and updated if relevant.  GEN: no acute illness or fever CV: No chest pain or shortness of breath MSK: detailed above Neuro: neurological signs are described above ROS O/w per HPI  Objective:   BP 122/84 mmHg  Pulse 72  Temp(Src) 98.3 F (36.8 C) (Oral)  Ht '6\' 4"'  (1.93 m)  Wt 236 lb 6.4 oz  (107.23 kg)  BMI 28.79 kg/m2  SpO2 97%   GEN: Well-developed,well-nourished,in no acute distress; alert,appropriate and cooperative throughout examination HEENT: Normocephalic and atraumatic without obvious abnormalities. Ears, externally no deformities PULM: Breathing comfortably in no respiratory distress EXT: No clubbing, cyanosis, or edema PSYCH: Normally interactive. Cooperative during the interview. Pleasant. Friendly and conversant. Not anxious or depressed appearing. Normal, full affect.  CERVICAL SPINE EXAM Range of motion: Flexion, extension, lateral bending, and rotation: full Pain with terminal motion: no Spinous Processes: NT SCM: NT Upper paracervical muscles: nt Upper traps: NT C5-T1 intact, sensation and motor   He points to the medial upper arm deep as the point of maximal discomfort.  No swelling.  Shoulder: L Inspection: No muscle wasting or winging Ecchymosis/edema: neg  AC joint, scapula, clavicle: NT Cervical spine: NT, full ROM Spurling's: neg Abduction: full, 5/5 Flexion: full, 5/5 IR, full, lift-off: 5/5 ER at neutral: full, 5/5 AC crossover and compression: neg Neer: neg Hawkins: neg Drop Test: neg Empty Can: neg Supraspinatus insertion: NT Bicipital groove: NT Speed's: neg Yergason's: neg Sulcus sign: neg Scapular dyskinesis: none Sensation intact Grip 5/5   L elbow Ecchymosis or edema: neg ROM: full flexion, extension, pronation, supination Shoulder ROM: Full Flexion: 5/5 Extension: 5/5 Supination: 5/5  Pronation: 5/5 Wrist ext: 5/5 Wrist flexion: 5/5 No gross bony abnormality Varus and Valgus stress: stable ECRB tenderness: neg Medial epicondyle: NT Lateral epicondyle, resisted wrist extension from wrist full pronation  and flexion: NT grip: 5/5  sensation intact Tinel's, Elbow: negative   Radiology: No results found.  Assessment and Plan:   Musculocutaneous neuropathy, left  Most likely acute neuritis versus  neuropathy on the left, more likely musculocutaneous nerve, radial nerve is possible.  No distal numbness in the hand.  Strength appears to be preserved on my exam, but he does have some subjective thought that it may be slightly diminished in his upper extremity on the left.  Off and on.  Discussed that these issues typically resolve with conservative care.  He is a Engineer, building services, so I'm going to do a trial of some prednisone to see if this patient's his recovery.  If he is not improved in a few weeks, then further investigation such as EMG should be undertaken.  New Prescriptions   PREDNISONE (DELTASONE) 20 MG TABLET    2 tabs for 4 days, then 1 tab for 3 days   No orders of the defined types were placed in this encounter.    Signed,  Maud Deed. Kele Withem, MD   Patient's Medications  New Prescriptions   PREDNISONE (DELTASONE) 20 MG TABLET    2 tabs for 4 days, then 1 tab for 3 days  Previous Medications   ACCU-CHEK FASTCLIX LANCETS MISC       ACCU-CHEK SMARTVIEW TEST STRIP       BLOOD GLUCOSE MONITORING SUPPL (ACCU-CHEK NANO SMARTVIEW) W/DEVICE KIT    Use to check blood sugar daily.  Dx: E11.9   METFORMIN (GLUCOPHAGE-XR) 500 MG 24 HR TABLET    TAKE 1 TABLET (500 MG TOTAL) BY MOUTH DAILY WITH BREAKFAST.  Modified Medications   No medications on file  Discontinued Medications   No medications on file

## 2015-02-06 NOTE — Progress Notes (Signed)
Pre visit review using our clinic review tool, if applicable. No additional management support is needed unless otherwise documented below in the visit note. 

## 2015-03-03 ENCOUNTER — Telehealth: Payer: Self-pay | Admitting: Family Medicine

## 2015-03-03 DIAGNOSIS — E119 Type 2 diabetes mellitus without complications: Secondary | ICD-10-CM

## 2015-03-03 NOTE — Telephone Encounter (Signed)
-----   Message from Terri J Walsh sent at 03/02/2015 12:54 PM EDT ----- Regarding: Lab orders for Monday, 4.25.16 Lab orders for a 3 month f/u 

## 2015-03-06 ENCOUNTER — Other Ambulatory Visit: Payer: BLUE CROSS/BLUE SHIELD

## 2015-03-10 ENCOUNTER — Ambulatory Visit: Payer: BLUE CROSS/BLUE SHIELD | Admitting: Family Medicine

## 2015-05-31 ENCOUNTER — Other Ambulatory Visit: Payer: Self-pay | Admitting: Family Medicine

## 2015-05-31 NOTE — Telephone Encounter (Signed)
Please call and schedule Diabetic Follow up with fasting labs prior with Dr. Ermalene SearingBedsole.

## 2015-07-06 ENCOUNTER — Telehealth: Payer: Self-pay | Admitting: Family Medicine

## 2015-07-06 NOTE — Telephone Encounter (Signed)
Please call and schedule Diabetes Follow up with fasting labs prior with Dr. Ermalene Searing.  Looks like I sent this request last month and it was never scheduled.  Thanks.

## 2015-07-06 NOTE — Telephone Encounter (Signed)
Left message asking pt to call office  °

## 2015-07-10 NOTE — Telephone Encounter (Signed)
Left message asking pt to call office  °

## 2015-07-13 NOTE — Telephone Encounter (Signed)
Please close

## 2015-07-13 NOTE — Telephone Encounter (Signed)
Pt stopped in and scheduled appts for labs and with Dr. Ermalene Searing.

## 2015-07-13 NOTE — Telephone Encounter (Signed)
Lab 9/9 Follow up 9/13

## 2015-07-21 ENCOUNTER — Other Ambulatory Visit: Payer: BLUE CROSS/BLUE SHIELD

## 2015-07-25 ENCOUNTER — Ambulatory Visit: Payer: BLUE CROSS/BLUE SHIELD | Admitting: Family Medicine

## 2015-09-11 ENCOUNTER — Other Ambulatory Visit (INDEPENDENT_AMBULATORY_CARE_PROVIDER_SITE_OTHER): Payer: BLUE CROSS/BLUE SHIELD

## 2015-09-11 DIAGNOSIS — E119 Type 2 diabetes mellitus without complications: Secondary | ICD-10-CM

## 2015-09-11 LAB — COMPREHENSIVE METABOLIC PANEL
ALBUMIN: 4.3 g/dL (ref 3.5–5.2)
ALK PHOS: 55 U/L (ref 39–117)
ALT: 22 U/L (ref 0–53)
AST: 19 U/L (ref 0–37)
BUN: 12 mg/dL (ref 6–23)
CALCIUM: 9.7 mg/dL (ref 8.4–10.5)
CHLORIDE: 105 meq/L (ref 96–112)
CO2: 29 mEq/L (ref 19–32)
CREATININE: 1.12 mg/dL (ref 0.40–1.50)
GFR: 89.3 mL/min (ref >60.00)
Glucose, Bld: 127 mg/dL — ABNORMAL HIGH (ref 70–99)
POTASSIUM: 4.1 meq/L (ref 3.5–5.1)
Sodium: 141 mEq/L (ref 135–145)
Total Bilirubin: 1 mg/dL (ref 0.2–1.2)
Total Protein: 7.5 g/dL (ref 6.0–8.3)

## 2015-09-11 LAB — LIPID PANEL
CHOLESTEROL: 177 mg/dL (ref 0–200)
HDL: 35.1 mg/dL — ABNORMAL LOW (ref >39.00)
LDL CALC: 124 mg/dL — AB (ref 0–99)
NonHDL: 141.64
TRIGLYCERIDES: 87 mg/dL (ref 0.0–149.0)
Total CHOL/HDL Ratio: 5
VLDL: 17.4 mg/dL (ref 0.0–40.0)

## 2015-09-11 LAB — HEMOGLOBIN A1C: Hgb A1c MFr Bld: 6.2 % (ref 4.6–6.5)

## 2015-09-14 ENCOUNTER — Encounter: Payer: Self-pay | Admitting: Family Medicine

## 2015-09-14 ENCOUNTER — Ambulatory Visit (INDEPENDENT_AMBULATORY_CARE_PROVIDER_SITE_OTHER): Payer: BLUE CROSS/BLUE SHIELD | Admitting: Family Medicine

## 2015-09-14 VITALS — BP 120/80 | HR 65 | Temp 97.8°F | Ht 76.0 in | Wt 237.5 lb

## 2015-09-14 DIAGNOSIS — E119 Type 2 diabetes mellitus without complications: Secondary | ICD-10-CM | POA: Diagnosis not present

## 2015-09-14 DIAGNOSIS — E785 Hyperlipidemia, unspecified: Secondary | ICD-10-CM | POA: Diagnosis not present

## 2015-09-14 DIAGNOSIS — G44219 Episodic tension-type headache, not intractable: Secondary | ICD-10-CM

## 2015-09-14 DIAGNOSIS — R519 Headache, unspecified: Secondary | ICD-10-CM | POA: Insufficient documentation

## 2015-09-14 DIAGNOSIS — R51 Headache: Secondary | ICD-10-CM

## 2015-09-14 LAB — HM DIABETES FOOT EXAM

## 2015-09-14 NOTE — Assessment & Plan Note (Signed)
Mild intermittent. Likely tension headache.  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 We can test for this with labs.

## 2015-09-14 NOTE — Assessment & Plan Note (Signed)
Excellent improvement in control with lifestyle changes and low sugar diet.

## 2015-09-14 NOTE — Assessment & Plan Note (Addendum)
Inadequate control. Info given. Pt will work aggressively on low chol diet. Counseled on increase exercsie, weight loss and exchanging animal fats for veggie fats.

## 2015-09-14 NOTE — Progress Notes (Signed)
Pre visit review using our clinic review tool, if applicable. No additional management support is needed unless otherwise documented below in the visit note. 

## 2015-09-14 NOTE — Addendum Note (Signed)
Addended by: Alvina ChouWALSH, Yeiden Frenkel J on: 09/14/2015 04:31 PM   Modules accepted: Orders

## 2015-09-14 NOTE — Progress Notes (Signed)
49 year old male presents for overdue DM follow up.   Has headaches at times, mild, squeezing ache in head, resolves with tylenol/ibuprofen. 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  BP Readings from Last 3 Encounters:  09/14/15 120/80  02/06/15 122/84  12/08/14 114/78   Diabetes: Well controlled on metformin 1 tab 500 mg daily Lab Results  Component Value Date   HGBA1C 6.2 09/11/2015  Using medications without difficulties: none Hypoglycemic episodes: none Hyperglycemic episodes:None Feet problems:none Blood Sugars averaging: FBS 90-117 eye exam within last year: yes  Has decreased sugar in diet.  Exercising at gym 3 days a week.  Wt Readings from Last 3 Encounters:  09/14/15 237 lb 8 oz (107.729 kg)  02/06/15 236 lb 6.4 oz (107.23 kg)  12/08/14 240 lb 8 oz (109.09 kg)     Elevated Cholesterol: LDL not at Goal < 100 on no med. Lab Results  Component Value Date   CHOL 177 09/11/2015   HDL 35.10* 09/11/2015   LDLCALC 124* 09/11/2015   LDLDIRECT 133.6 02/25/2011   TRIG 87.0 09/11/2015   CHOLHDL 5 09/11/2015   Using medications without problems:none Muscle aches: None Diet compliance:poor until recently. Exercise: 3 days a week. Other complaints:   Review of Systems  Constitutional: Negative for fever, fatigue and unexpected weight change.  HENT: Negative for congestion, ear pain, postnasal drip, rhinorrhea, sore throat and trouble swallowing.  Eyes: Negative for pain.  Respiratory: Negative for cough, shortness of breath and wheezing.  Cardiovascular: Negative for chest pain, palpitations and leg swelling.  Gastrointestinal: Negative for nausea, abdominal pain, diarrhea, constipation and blood in stool.  Genitourinary: Negative for dysuria, urgency, hematuria, discharge, penile swelling, scrotal swelling, difficulty urinating, penile pain and testicular pain.  Skin: Negative for rash.  Neurological:  Negative for syncope, weakness, light-headedness, numbness. Psychiatric/Behavioral: Negative for behavioral problems and dysphoric mood. The patient is not nervous/anxious.       Objective:   Physical Exam  Constitutional: He appears well-developed and well-nourished. Non-toxic appearance. He does not appear ill. No distress.  HENT:  Head: Normocephalic and atraumatic.  Right Ear: Hearing, tympanic membrane, external ear and ear canal normal.  Left Ear: Hearing, tympanic membrane, external ear and ear canal normal.  Nose: Nose normal.  Mouth/Throat: Uvula is midline, oropharynx is clear and moist and mucous membranes are normal.  Eyes: Conjunctivae, EOM and lids are normal. Pupils are equal, round, and reactive to light. Lids are everted and swept, no foreign bodies found.  Neck: Trachea normal, normal range of motion and phonation normal. Neck supple. Carotid bruit is not present. No thyroid mass and no thyromegaly present.  Cardiovascular: Normal rate, regular rhythm, S1 normal, S2 normal, intact distal pulses and normal pulses. Exam reveals no gallop.  No murmur heard. Pulmonary/Chest: Breath sounds normal. He has no wheezes. He has no rhonchi. He has no rales.  Abdominal: Soft. Normal appearance and bowel sounds are normal. There is no hepatosplenomegaly. There is no tenderness. There is no rebound, no guarding and no CVA tenderness. No hernia. Hernia confirmed negative in the right inguinal area and confirmed negative in the left inguinal area.   Lymphadenopathy:   He has no cervical adenopathy.   Right: No inguinal adenopathy present.   Left: No inguinal adenopathy present.  Neurological: He is alert. He has normal strength and normal reflexes. No cranial nerve deficit or sensory deficit. Gait normal.  Skin: Skin is  warm, dry and intact. No rash noted.  Psychiatric: He has a normal mood and affect. His speech is normal and behavior is normal. Judgment normal.    Diabetic foot exam: Normal inspection No skin breakdown No calluses  Normal DP pulses Normal sensation to light touch and monofilament Nails normal

## 2015-09-14 NOTE — Patient Instructions (Addendum)
Keep up great work  On low carb diet , now add low fat low cholesterol diet. Decrease animal fats replace with veggie fats. You goal LDL < 100.  Stop at lab on way out for microalbumin test.

## 2015-09-15 LAB — MICROALBUMIN / CREATININE URINE RATIO
CREATININE, U: 115.5 mg/dL
MICROALB/CREAT RATIO: 0.6 mg/g (ref 0.0–30.0)
Microalb, Ur: <0.7 mg/dL (ref 0.0–1.9)

## 2015-09-18 ENCOUNTER — Encounter: Payer: Self-pay | Admitting: *Deleted

## 2016-03-11 ENCOUNTER — Telehealth: Payer: Self-pay | Admitting: Family Medicine

## 2016-03-11 ENCOUNTER — Other Ambulatory Visit: Payer: BLUE CROSS/BLUE SHIELD

## 2016-03-11 DIAGNOSIS — E119 Type 2 diabetes mellitus without complications: Secondary | ICD-10-CM

## 2016-03-11 DIAGNOSIS — E785 Hyperlipidemia, unspecified: Secondary | ICD-10-CM

## 2016-03-11 DIAGNOSIS — Z125 Encounter for screening for malignant neoplasm of prostate: Secondary | ICD-10-CM

## 2016-03-11 NOTE — Telephone Encounter (Signed)
-----   Message from Natasha C Chavers sent at 03/05/2016 11:21 AM EDT ----- Regarding: Cpx labs Mon 5/1, need orders. Thanks! :-) Please order  future cpx labs for pt's upcoming lab appt. Thanks Tasha  

## 2016-03-15 ENCOUNTER — Encounter: Payer: BLUE CROSS/BLUE SHIELD | Admitting: Family Medicine

## 2017-01-13 DIAGNOSIS — Z1211 Encounter for screening for malignant neoplasm of colon: Secondary | ICD-10-CM | POA: Diagnosis not present

## 2017-01-13 DIAGNOSIS — D124 Benign neoplasm of descending colon: Secondary | ICD-10-CM | POA: Diagnosis not present

## 2017-06-24 DIAGNOSIS — E1169 Type 2 diabetes mellitus with other specified complication: Secondary | ICD-10-CM | POA: Diagnosis not present

## 2017-06-24 DIAGNOSIS — H524 Presbyopia: Secondary | ICD-10-CM | POA: Diagnosis not present

## 2017-06-24 DIAGNOSIS — E0869 Diabetes mellitus due to underlying condition with other specified complication: Secondary | ICD-10-CM | POA: Diagnosis not present

## 2018-05-29 DIAGNOSIS — H52223 Regular astigmatism, bilateral: Secondary | ICD-10-CM | POA: Diagnosis not present

## 2018-11-24 ENCOUNTER — Encounter: Payer: Self-pay | Admitting: Internal Medicine

## 2018-11-24 ENCOUNTER — Ambulatory Visit: Payer: BLUE CROSS/BLUE SHIELD | Admitting: Internal Medicine

## 2018-11-24 VITALS — BP 120/84 | HR 63 | Temp 98.0°F | Ht 73.2 in | Wt 230.2 lb

## 2018-11-24 DIAGNOSIS — Z0001 Encounter for general adult medical examination with abnormal findings: Secondary | ICD-10-CM | POA: Diagnosis not present

## 2018-11-24 DIAGNOSIS — Z1211 Encounter for screening for malignant neoplasm of colon: Secondary | ICD-10-CM | POA: Diagnosis not present

## 2018-11-24 DIAGNOSIS — Z0000 Encounter for general adult medical examination without abnormal findings: Secondary | ICD-10-CM | POA: Diagnosis not present

## 2018-11-24 DIAGNOSIS — E782 Mixed hyperlipidemia: Secondary | ICD-10-CM | POA: Diagnosis not present

## 2018-11-24 DIAGNOSIS — Z125 Encounter for screening for malignant neoplasm of prostate: Secondary | ICD-10-CM

## 2018-11-24 DIAGNOSIS — E1165 Type 2 diabetes mellitus with hyperglycemia: Secondary | ICD-10-CM | POA: Diagnosis not present

## 2018-11-24 LAB — POCT URINALYSIS DIPSTICK
Bilirubin, UA: NEGATIVE
Blood, UA: NEGATIVE
Glucose, UA: NEGATIVE
Ketones, UA: NEGATIVE
Leukocytes, UA: NEGATIVE
NITRITE UA: NEGATIVE
Protein, UA: NEGATIVE
SPEC GRAV UA: 1.015 (ref 1.010–1.025)
UROBILINOGEN UA: 0.2 U/dL
pH, UA: 5.5 (ref 5.0–8.0)

## 2018-11-24 LAB — POCT UA - MICROALBUMIN
Creatinine, POC: 50 mg/dL
Microalbumin Ur, POC: 10 mg/L

## 2018-11-24 LAB — POC HEMOCCULT BLD/STL (OFFICE/1-CARD/DIAGNOSTIC): Fecal Occult Blood, POC: NEGATIVE

## 2018-11-24 NOTE — Progress Notes (Signed)
Subjective:     Patient ID: Brandon Walls , male    DOB: May 11, 1966 , 53 y.o.   MRN: [id number]   Chief Complaint  Patient presents with  . Annual Exam    HPI Pt is here for a yearly physical. Had his eye xam in the past few months. No eye disease found. Has been working in eating healthier since he did not during the holidays.   Past Medical History:  Diagnosis Date  . Hyperlipidemia      Family History  Problem Relation Age of Onset  . Cancer Mother        Lymphoma  . Coronary artery disease Mother 48       small MI, no stents  . Dementia Maternal Grandmother   . ALS Brother        half-brother     Current Outpatient Medications:  .  ACCU-CHEK FASTCLIX LANCETS MISC, , Disp: , Rfl:  .  ACCU-CHEK SMARTVIEW test strip, , Disp: , Rfl:  .  Blood Glucose Monitoring Suppl (ACCU-CHEK NANO SMARTVIEW) W/DEVICE KIT, Use to check blood sugar daily.  Dx: E11.9 (Patient not taking: Reported on 11/24/2018), Disp: 1 kit, Rfl: 0 .  metFORMIN (GLUCOPHAGE-XR) 500 MG 24 hr tablet, TAKE 1 TABLET (500 MG TOTAL) BY MOUTH DAILY WITH BREAKFAST. (Patient not taking: Reported on 11/24/2018), Disp: 30 tablet, Rfl: 0   No Known Allergies   Review of Systems  Constitutional: Negative.   HENT: Positive for dental problem. Negative for congestion, drooling and ear discharge.        Has a broken tooth which he is getting care for.   Eyes: Negative for photophobia, pain, discharge, redness, itching and visual disturbance.       Eye xm is current  Respiratory: Negative for cough, shortness of breath and wheezing.   Cardiovascular: Negative for chest pain, palpitations and leg swelling.  Gastrointestinal: Positive for constipation. Negative for abdominal distention, abdominal pain, blood in stool, diarrhea, nausea and vomiting.       Gets occasional constipation depending on his diet. Denies GERD  Endocrine: Negative for cold intolerance, heat intolerance, polydipsia and polyphagia.  Genitourinary:  Negative for difficulty urinating, dysuria, enuresis, frequency, penile pain, scrotal swelling and urgency.  Musculoskeletal: Positive for arthralgias and back pain.       Has chronic knee pain off and on and back pain. Back soreness is on R lower thoracic muscle area. Used to see a chiropractor in the past, has not seen one a a while.   Skin: Negative for color change, rash and wound.  Allergic/Immunologic: Positive for environmental allergies. Negative for food allergies.  Neurological: Negative for dizziness, tremors, weakness, numbness and headaches.  Hematological: Negative for adenopathy. Does not bruise/bleed easily.  Psychiatric/Behavioral: Negative for sleep disturbance. The patient is not nervous/anxious.        Denies depression     Today's Vitals   11/24/18 1024  BP: 120/84  Pulse: 63  Temp: 98 F (36.7 C)  TempSrc: Oral  SpO2: 96%  Weight: 230 lb 3.2 oz (104.4 kg)  Height: 6' 1.2" (1.859 m)  PainSc: 0-No pain   Body mass index is 30.21 kg/m.   Objective:  Physical Exam  BP 120/84 (BP Location: Left Arm, Patient Position: Sitting, Cuff Size: Large)   Pulse 63   Temp 98 F (36.7 C) (Oral)   Ht 6' 1.2" (1.859 m)   Wt 230 lb 3.2 oz (104.4 kg)   SpO2 96%   BMI 30.21  kg/m   General Appearance:    Alert, cooperative, no distress, appears stated age  Head:    Normocephalic, without obvious abnormality, atraumatic  Eyes:    PERRL, conjunctiva/corneas clear, EOM's intact, fundi    benign, both eyes       Ears:    Normal TM's and external ear canals, both ears  Nose:   Nares normal, septum midline, mucosa normal, no drainage   or sinus tenderness  Throat:   Lips, mucosa, and tongue normal; teeth and gums normal  Neck:   Supple, symmetrical, trachea midline, no adenopathy;       thyroid:  No enlargement/tenderness/nodules; no carotid   Bruits.   Back:     Symmetric, no curvature, ROM normal, no CVA tenderness  Lungs:     Clear to auscultation bilaterally,  respirations unlabored  Chest wall:    No tenderness or deformity  Heart:    Regular rate and rhythm, S1 and S2 normal, no murmur, rub   or gallop  Abdomen:     Soft, non-tender, bowel sounds active all four quadrants,    no masses, no organomegaly  Genitalia:    Normal male without lesion, discharge or tenderness  Rectal:    Normal tone, normal prostate, no masses or tenderness;   guaiac negative stool  Extremities:   Extremities normal, atraumatic, no cyanosis or edema  Pulses:   2+ and symmetric all extremities  Skin:   Skin color, texture, turgor normal, no rashes or lesions  Lymph nodes:   Cervical, supraclavicular, and axillary nodes normal  Neurologic:   CNII-XII intact. Normal strength, sensation and reflexes      Throughout Spine- has normal ROM, but his L hip seems a little higher thant the R and seems to have a more pronounced central indention on lower thorax and lumbar region. No scoliosis noted.        Assessment And Plan:    1. Uncontrolled type 2 diabetes mellitus with hyperglycemia (HCC) Chronic. - Hemoglobin A1c  2. Mixed hyperlipidemia- chronic - Lipid Profile - TSH - T3, free - T4, Free  3. Encounter for general adult medical examination with abnormal findings- routine - CMP14 + Anion Gap - CBC no Diff  4. Screen for colon cancer- screen. Colonoscopy per his history was age 43 y. We have requested his report.  - POC Hemoccult Bld/Stl (1-Cd Office Dx)  5. Screening for prostate cancer- routine. - PSA  May continue same meds. FU 1y for physical. FU 3 months for chronic conditions  Toussaint Golson RODRIGUEZ-SOUTHWORTH, PA-C

## 2018-11-24 NOTE — Patient Instructions (Signed)
Preventive Care 40-64 Years, Male Preventive care refers to lifestyle choices and visits with your health care provider that can promote health and wellness. What does preventive care include?   A yearly physical exam. This is also called an annual well check.  Dental exams once or twice a year.  Routine eye exams. Ask your health care provider how often you should have your eyes checked.  Personal lifestyle choices, including: ? Daily care of your teeth and gums. ? Regular physical activity. ? Eating a healthy diet. ? Avoiding tobacco and drug use. ? Limiting alcohol use. ? Practicing safe sex. ? Taking low-dose aspirin every day starting at age 50. What happens during an annual well check? The services and screenings done by your health care provider during your annual well check will depend on your age, overall health, lifestyle risk factors, and family history of disease. Counseling Your health care provider may ask you questions about your:  Alcohol use.  Tobacco use.  Drug use.  Emotional well-being.  Home and relationship well-being.  Sexual activity.  Eating habits.  Work and work environment. Screening You may have the following tests or measurements:  Height, weight, and BMI.  Blood pressure.  Lipid and cholesterol levels. These may be checked every 5 years, or more frequently if you are over 50 years old.  Skin check.  Lung cancer screening. You may have this screening every year starting at age 55 if you have a 30-pack-year history of smoking and currently smoke or have quit within the past 15 years.  Colorectal cancer screening. All adults should have this screening starting at age 50 and continuing until age 75. Your health care provider may recommend screening at age 45. You will have tests every 1-10 years, depending on your results and the type of screening test. People at increased risk should start screening at an earlier age. Screening tests may  include: ? Guaiac-based fecal occult blood testing. ? Fecal immunochemical test (FIT). ? Stool DNA test. ? Virtual colonoscopy. ? Sigmoidoscopy. During this test, a flexible tube with a tiny camera (sigmoidoscope) is used to examine your rectum and lower colon. The sigmoidoscope is inserted through your anus into your rectum and lower colon. ? Colonoscopy. During this test, a long, thin, flexible tube with a tiny camera (colonoscope) is used to examine your entire colon and rectum.  Prostate cancer screening. Recommendations will vary depending on your family history and other risks.  Hepatitis C blood test.  Hepatitis B blood test.  Sexually transmitted disease (STD) testing.  Diabetes screening. This is done by checking your blood sugar (glucose) after you have not eaten for a while (fasting). You may have this done every 1-3 years. Discuss your test results, treatment options, and if necessary, the need for more tests with your health care provider. Vaccines Your health care provider may recommend certain vaccines, such as:  Influenza vaccine. This is recommended every year.  Tetanus, diphtheria, and acellular pertussis (Tdap, Td) vaccine. You may need a Td booster every 10 years.  Varicella vaccine. You may need this if you have not been vaccinated.  Zoster vaccine. You may need this after age 60.  Measles, mumps, and rubella (MMR) vaccine. You may need at least one dose of MMR if you were born in 1957 or later. You may also need a second dose.  Pneumococcal 13-valent conjugate (PCV13) vaccine. You may need this if you have certain conditions and have not been vaccinated.  Pneumococcal polysaccharide (PPSV23) vaccine.   You may need one or two doses if you smoke cigarettes or if you have certain conditions.  Meningococcal vaccine. You may need this if you have certain conditions.  Hepatitis A vaccine. You may need this if you have certain conditions or if you travel or work in  places where you may be exposed to hepatitis A.  Hepatitis B vaccine. You may need this if you have certain conditions or if you travel or work in places where you may be exposed to hepatitis B.  Haemophilus influenzae type b (Hib) vaccine. You may need this if you have certain risk factors. Talk to your health care provider about which screenings and vaccines you need and how often you need them. This information is not intended to replace advice given to you by your health care provider. Make sure you discuss any questions you have with your health care provider. Document Released: 11/24/2015 Document Revised: 12/18/2017 Document Reviewed: 08/29/2015 Elsevier Interactive Patient Education  2019 [employer].  

## 2018-11-25 ENCOUNTER — Encounter: Payer: Self-pay | Admitting: Internal Medicine

## 2018-11-25 LAB — CMP14 + ANION GAP
ALT: 19 IU/L (ref 0–44)
AST: 16 IU/L (ref 0–40)
Albumin/Globulin Ratio: 1.5 (ref 1.2–2.2)
Albumin: 4.5 g/dL (ref 3.5–5.5)
Alkaline Phosphatase: 66 IU/L (ref 39–117)
Anion Gap: 16 mmol/L (ref 10.0–18.0)
BUN / CREAT RATIO: 8 — AB (ref 9–20)
BUN: 10 mg/dL (ref 6–24)
Bilirubin Total: 1.2 mg/dL (ref 0.0–1.2)
CO2: 24 mmol/L (ref 20–29)
Calcium: 9.7 mg/dL (ref 8.7–10.2)
Chloride: 101 mmol/L (ref 96–106)
Creatinine, Ser: 1.18 mg/dL (ref 0.76–1.27)
GFR calc Af Amer: 81 mL/min/{1.73_m2} (ref >59)
GFR, EST NON AFRICAN AMERICAN: 70 mL/min/{1.73_m2} (ref >59)
GLOBULIN, TOTAL: 3 g/dL (ref 1.5–4.5)
Glucose: 165 mg/dL — ABNORMAL HIGH (ref 65–99)
POTASSIUM: 4.5 mmol/L (ref 3.5–5.2)
SODIUM: 141 mmol/L (ref 134–144)
Total Protein: 7.5 g/dL (ref 6.0–8.5)

## 2018-11-25 LAB — CBC
Hematocrit: 48.1 % (ref 37.5–51.0)
Hemoglobin: 16.2 g/dL (ref 13.0–17.7)
MCH: 28.3 pg (ref 26.6–33.0)
MCHC: 33.7 g/dL (ref 31.5–35.7)
MCV: 84 fL (ref 79–97)
PLATELETS: 179 10*3/uL (ref 150–450)
RBC: 5.72 x10E6/uL (ref 4.14–5.80)
RDW: 12.8 % (ref 11.6–15.4)
WBC: 4.3 10*3/uL (ref 3.4–10.8)

## 2018-11-25 LAB — T4, FREE: Free T4: 1.5 ng/dL (ref 0.82–1.77)

## 2018-11-25 LAB — LIPID PANEL
Chol/HDL Ratio: 5.2 ratio — ABNORMAL HIGH (ref 0.0–5.0)
Cholesterol, Total: 193 mg/dL (ref 100–199)
HDL: 37 mg/dL — ABNORMAL LOW (ref >39)
LDL Calculated: 134 mg/dL — ABNORMAL HIGH (ref 0–99)
Triglycerides: 108 mg/dL (ref 0–149)
VLDL CHOLESTEROL CAL: 22 mg/dL (ref 5–40)

## 2018-11-25 LAB — HEMOGLOBIN A1C
ESTIMATED AVERAGE GLUCOSE: 223 mg/dL
HEMOGLOBIN A1C: 9.4 % — AB (ref 4.8–5.6)

## 2018-11-25 LAB — T3, FREE: T3 FREE: 3.5 pg/mL (ref 2.0–4.4)

## 2018-11-25 LAB — PSA: Prostate Specific Ag, Serum: 0.9 ng/mL (ref 0.0–4.0)

## 2018-11-25 LAB — TSH: TSH: 1.51 u[IU]/mL (ref 0.450–4.500)

## 2018-11-26 ENCOUNTER — Other Ambulatory Visit: Payer: Self-pay | Admitting: Internal Medicine

## 2018-11-26 NOTE — Progress Notes (Signed)
Please call pt and read him his results from lab tab. I sent it via Mychart. His Metformin needs to be increased to 1000 mg bid with meals, HGBA1C is 9.4 and needs to be 5.6, his bad cholesterol needs to be 70 or less to prevent cardiac event and his is 139 so needs to be on Crestor, and I also added lisinopril low dose which is a medication to protect his kidneys from DM.   Please ask him where should we send his Rx,  You may send it. I have it prepared.   Per Brennan Bailey. Pt declined medication, want to take it of it via diet since he has done this in the past.

## 2018-11-27 ENCOUNTER — Telehealth: Payer: Self-pay

## 2018-11-27 NOTE — Telephone Encounter (Signed)
The patient said that he isn't going to do any medication at this time he will do diet and exercise for the three months before he comes back for a f/u.

## 2018-11-27 NOTE — Telephone Encounter (Signed)
OK 

## 2018-12-01 NOTE — Progress Notes (Signed)
noted 

## 2019-02-18 ENCOUNTER — Encounter: Payer: Self-pay | Admitting: Internal Medicine

## 2019-02-22 ENCOUNTER — Ambulatory Visit: Payer: BLUE CROSS/BLUE SHIELD | Admitting: Internal Medicine

## 2019-08-09 ENCOUNTER — Other Ambulatory Visit: Payer: Self-pay

## 2019-08-23 ENCOUNTER — Ambulatory Visit (INDEPENDENT_AMBULATORY_CARE_PROVIDER_SITE_OTHER): Payer: BC Managed Care – PPO | Admitting: Internal Medicine

## 2019-08-23 ENCOUNTER — Other Ambulatory Visit: Payer: Self-pay

## 2019-08-23 ENCOUNTER — Encounter: Payer: Self-pay | Admitting: Internal Medicine

## 2019-08-23 VITALS — BP 142/72 | HR 60 | Temp 98.1°F | Ht 75.2 in | Wt 227.4 lb

## 2019-08-23 DIAGNOSIS — R030 Elevated blood-pressure reading, without diagnosis of hypertension: Secondary | ICD-10-CM | POA: Diagnosis not present

## 2019-08-23 DIAGNOSIS — E7800 Pure hypercholesterolemia, unspecified: Secondary | ICD-10-CM

## 2019-08-23 DIAGNOSIS — E1165 Type 2 diabetes mellitus with hyperglycemia: Secondary | ICD-10-CM | POA: Diagnosis not present

## 2019-08-23 MED ORDER — GLUCOSE BLOOD VI STRP: ORAL_STRIP | 12 refills | Status: DC

## 2019-08-23 NOTE — Patient Instructions (Signed)
Preventing Influenza, Adult Influenza, more commonly known as "the flu," is a viral infection that mainly affects the respiratory tract. The respiratory tract includes structures that help you breathe, such as the lungs, nose, and throat. The flu causes many common cold symptoms, as well as a high fever and body aches. The flu spreads easily from person to person (is contagious). The flu is most common from December through March. This is called flu season.You can catch the flu virus by:  Breathing in droplets from an infected person's cough or sneeze.  Touching something that was recently contaminated with the virus and then touching your mouth, nose, or eyes. What can I do to lower my risk?        You can decrease your risk of getting the flu by:  Getting a flu shot (influenza vaccination) every year. This is the best way to prevent the flu. A flu shot is recommended for everyone age 6 months and older. ? It is best to get a flu shot in the fall, as soon as it is available. Getting a flu shot during winter or spring instead is still a good idea. Flu season can last into early spring. ? Preventing the flu through vaccination requires getting a new flu shot every year. This is because the flu virus changes slightly (mutates) from one year to the next. Even if a flu shot does not completely protect you from all flu virus mutations, it can reduce the severity of your illness and prevent dangerous complications of the flu. ? If you are pregnant, you can and should get a flu shot. ? If you have had a reaction to the shot in the past or if you are allergic to eggs, check with your health care provider before getting a flu shot. ? Sometimes the vaccine is available as a nasal spray. In some years, the nasal spray has not been as effective against the flu virus. Check with your health care provider if you have questions about this.  Practicing good health habits. This is especially important during  flu season. ? Avoid contact with people who are sick with flu or cold symptoms. ? Wash your hands with soap and water often. If soap and water are not available, use alcohol-based hand sanitizer. ? Avoid touching your hands to your face, especially when you have not washed your hands recently. ? Use a disinfectant to clean surfaces at home and at work that may be contaminated with the flu virus. ? Keep your body's disease-fighting system (immune system) in good shape by eating a healthy diet, drinking plenty of fluids, getting enough sleep, and exercising regularly. If you do get the flu, avoid spreading it to others by:  Staying home until your symptoms have been gone for at least one day.  Covering your mouth and nose when you cough or sneeze.  Avoiding close contact with others, especially babies and elderly people. Why are these changes important? Getting a flu shot and practicing good health habits protects you as well as other people. If you get the flu, your friends, family, and co-workers are also at risk of getting it, because it spreads so easily to others. Each year, about 2 out of every 10 people get the flu. Having the flu can lead to complications, such as pneumonia, ear infection, and sinus infection. The flu also can be deadly, especially for babies, people older than age 65, and people who have serious long-term diseases. How is this treated? Most   people recover from the flu by resting at home and drinking plenty of fluids. However, a prescription antiviral medicine may reduce your flu symptoms and may make your flu go away sooner. This medicine must be started within a few days of getting flu symptoms. You can talk with your health care provider about whether you need an antiviral medicine. Antiviral medicine may be prescribed for people who are at risk for more serious flu symptoms. This includes people who:  Are older than age 65.  Are pregnant.  Have a condition that  makes the flu worse or more dangerous. Where to find more information  Centers for Disease Control and Prevention: www.cdc.gov/flu/index.htm  Flu.gov: www.flu.gov/prevention-vaccination  American Academy [employer]: familydoctor.org/familydoctor/en/kids/vaccines/preventing-the-flu.html Contact a health care provider if:  You have influenza and you develop new symptoms.  You have: ? Chest pain. ? Diarrhea. ? A fever.  Your cough gets worse, or you produce more mucus. Summary  The best way to prevent the flu is to get a flu shot every year in the fall.  Even if you get the flu after you have received the yearly vaccine, your flu may be milder and go away sooner because of your flu shot.  If you get the flu, antiviral medicines that are started with a few days of symptoms may reduce your flu symptoms and may make your flu go away sooner.  You can also help prevent the flu by practicing good health habits. This information is not intended to replace advice given to you by your health care provider. Make sure you discuss any questions you have with your health care provider. Document Released: 11/12/2015 Document Revised: 10/10/2017 Document Reviewed: 07/06/2016 Elsevier Patient Education  2020 [employer] Inc.  

## 2019-08-23 NOTE — Progress Notes (Signed)
Subjective:     Patient ID: Brandon Walls , male    DOB: Apr 17, 1966 , 53 y.o.   MRN: [id number]   Chief Complaint  Patient presents with  . Diabetes    HPI  He is here today for a diabetes check. He was last seen by me June 2017. He then transferred to a practice in [place], because it was closer to his job. He was seen by Sunday Spillers, [place] in Jan 2020. He is here b/c he is ready to improve his BS. He does not wish to take meds. He feels the body can heal itself. He admits that he has not been as diligent with his healthy lifestyle, and that his BS may be elevated. He is not checking his sugars regularly. Admits to having elevated a1c on life insurance evaluation.   Diabetes He presents for his follow-up diabetic visit. He has type 2 diabetes mellitus. There are no hypoglycemic associated symptoms. There are no hypoglycemic complications. Risk factors for coronary artery disease include dyslipidemia and diabetes mellitus. He is compliant with treatment some of the time. He is following a diabetic diet. He participates in exercise intermittently. An ACE inhibitor/angiotensin II receptor blocker is not being taken.     Past Medical History:  Diagnosis Date  . Hyperlipidemia      Family History  Problem Relation Age of Onset  . Cancer Mother        Lymphoma  . Coronary artery disease Mother 58       small MI, no stents  . Dementia Maternal Grandmother   . ALS Brother        half-brother     Current Outpatient Medications:  .  ACCU-CHEK FASTCLIX LANCETS MISC, , Disp: , Rfl:  .  ACCU-CHEK SMARTVIEW test strip, , Disp: , Rfl:  .  Blood Glucose Monitoring Suppl (ACCU-CHEK NANO SMARTVIEW) W/DEVICE KIT, Use to check blood sugar daily.  Dx: E11.9, Disp: 1 kit, Rfl: 0 .  glucose blood test strip, Use to check blood sugars twice daily. Dx code:e11.9, Disp: 100 each, Rfl: 12   No Known Allergies   Review of Systems  Constitutional: Negative.   Respiratory: Negative.   Cardiovascular:  Negative.   Gastrointestinal: Negative.   Neurological: Negative.   Psychiatric/Behavioral: Negative.      Today's Vitals   08/23/19 1406  BP: (!) 142/72  Pulse: 60  Temp: 98.1 F (36.7 C)  TempSrc: Oral  SpO2: 97%  Weight: 227 lb 6.4 oz (103.1 kg)  Height: 6' 3.2" (1.91 m)   Body mass index is 28.27 kg/m.   Objective:  Physical Exam Vitals signs and nursing note reviewed.  Constitutional:      Appearance: Normal appearance.  Cardiovascular:     Rate and Rhythm: Normal rate and regular rhythm.     Heart sounds: Normal heart sounds.  Pulmonary:     Effort: Pulmonary effort is normal.     Breath sounds: Normal breath sounds.  Skin:    General: Skin is warm.  Neurological:     General: No focal deficit present.     Mental Status: He is alert.  Psychiatric:        Mood and Affect: Mood normal.         Assessment And Plan:     1. Uncontrolled type 2 diabetes mellitus with hyperglycemia ([place])  I will check labs as listed below.  Labs also reviewed from Jan 2020. Importance of optimal glucose control to prevent ocular, cardiovascular, renal  and cerebrovascular complications. He has taken metformin in the past without issue. He would like to wait until lab results are available to determine which medication he should be taking. He declined flu vaccine, Tdap and pneumonia vaccines.   - CMP14+EGFR - Hemoglobin A1c   2. Pure hypercholesterolemia  Labs from Jan 2020 reviewed in full detail, LDL 134  Pt advised optimal LDL is less than 70 for diabetic patients. Again, he does not wish to take medication. He is advised to start fiber supplementation daily and to increase daily activity. I will recheck this in Jan 2021 during his physical.   3. Elevated blood pressure reading  He is aware of elevated BP reading. We discussed use of ACE inhibitors/ARBs for renal protection. I wanted to check a urine microalbumin, but specimen was unable to be obtained. I will check at Jan  2021 physical. Greater than 50% of face to face time was spent in counseling and coordination of care.  Maximino Greenland, MD    THE PATIENT IS ENCOURAGED TO PRACTICE SOCIAL DISTANCING DUE TO THE COVID-19 PANDEMIC.

## 2019-08-24 LAB — CMP14+EGFR
ALT: 20 IU/L (ref 0–44)
AST: 19 IU/L (ref 0–40)
Albumin/Globulin Ratio: 1.8 (ref 1.2–2.2)
Albumin: 4.8 g/dL (ref 3.8–4.9)
Alkaline Phosphatase: 61 IU/L (ref 39–117)
BUN/Creatinine Ratio: 8 — ABNORMAL LOW (ref 9–20)
BUN: 9 mg/dL (ref 6–24)
Bilirubin Total: 0.9 mg/dL (ref 0.0–1.2)
CO2: 23 mmol/L (ref 20–29)
Calcium: 9.5 mg/dL (ref 8.7–10.2)
Chloride: 104 mmol/L (ref 96–106)
Creatinine, Ser: 1.13 mg/dL (ref 0.76–1.27)
GFR calc Af Amer: 85 mL/min/{1.73_m2} (ref >59)
GFR calc non Af Amer: 74 mL/min/{1.73_m2} (ref >59)
Globulin, Total: 2.6 g/dL (ref 1.5–4.5)
Glucose: 105 mg/dL — ABNORMAL HIGH (ref 65–99)
Potassium: 4.6 mmol/L (ref 3.5–5.2)
Sodium: 142 mmol/L (ref 134–144)
Total Protein: 7.4 g/dL (ref 6.0–8.5)

## 2019-08-24 LAB — HEMOGLOBIN A1C
Est. average glucose Bld gHb Est-mCnc: 203 mg/dL
Hgb A1c MFr Bld: 8.7 % — ABNORMAL HIGH (ref 4.8–5.6)

## 2019-08-27 ENCOUNTER — Encounter: Payer: Self-pay | Admitting: Internal Medicine

## 2019-08-31 ENCOUNTER — Other Ambulatory Visit: Payer: Self-pay

## 2019-10-04 ENCOUNTER — Other Ambulatory Visit: Payer: Self-pay

## 2019-10-04 ENCOUNTER — Ambulatory Visit: Payer: BC Managed Care – PPO | Admitting: Internal Medicine

## 2019-10-04 ENCOUNTER — Encounter: Payer: Self-pay | Admitting: Internal Medicine

## 2019-10-04 ENCOUNTER — Other Ambulatory Visit: Payer: Self-pay | Admitting: Internal Medicine

## 2019-10-04 VITALS — BP 124/82 | HR 80 | Temp 97.8°F | Ht 75.2 in | Wt 232.8 lb

## 2019-10-04 DIAGNOSIS — Z79899 Other long term (current) drug therapy: Secondary | ICD-10-CM | POA: Diagnosis not present

## 2019-10-04 DIAGNOSIS — E1165 Type 2 diabetes mellitus with hyperglycemia: Secondary | ICD-10-CM | POA: Diagnosis not present

## 2019-10-04 DIAGNOSIS — D573 Sickle-cell trait: Secondary | ICD-10-CM | POA: Diagnosis not present

## 2019-10-04 DIAGNOSIS — Z86010 Personal history of colonic polyps: Secondary | ICD-10-CM | POA: Diagnosis not present

## 2019-10-04 MED ORDER — FARXIGA 10 MG PO TABS: 10.0000 mg | ORAL_TABLET | Freq: Every day | ORAL | 3 refills | Status: DC

## 2019-10-04 MED ORDER — ACCU-CHEK FASTCLIX LANCETS MISC: 2 refills | Status: DC

## 2019-10-04 NOTE — Patient Instructions (Signed)
Diabetes Mellitus and Foot Care Foot care is an important part of your health, especially when you have diabetes. Diabetes may cause you to have problems because of poor blood flow (circulation) to your feet and legs, which can cause your skin to:  Become thinner and drier.  Break more easily.  Heal more slowly.  Peel and crack. You may also have nerve damage (neuropathy) in your legs and feet, causing decreased feeling in them. This means that you may not notice minor injuries to your feet that could lead to more serious problems. Noticing and addressing any potential problems early is the best way to prevent future foot problems. How to care for your feet Foot hygiene  Wash your feet daily with warm water and mild soap. Do not use hot water. Then, pat your feet and the areas between your toes until they are completely dry. Do not soak your feet as this can dry your skin.  Trim your toenails straight across. Do not dig under them or around the cuticle. File the edges of your nails with an emery board or nail file.  Apply a moisturizing lotion or petroleum jelly to the skin on your feet and to dry, brittle toenails. Use lotion that does not contain alcohol and is unscented. Do not apply lotion between your toes. Shoes and socks  Wear clean socks or stockings every day. Make sure they are not too tight. Do not wear knee-high stockings since they may decrease blood flow to your legs.  Wear shoes that fit properly and have enough cushioning. Always look in your shoes before you put them on to be sure there are no objects inside.  To break in new shoes, wear them for just a few hours a day. This prevents injuries on your feet. Wounds, scrapes, corns, and calluses  Check your feet daily for blisters, cuts, bruises, sores, and redness. If you cannot see the bottom of your feet, use a mirror or ask someone for help.  Do not cut corns or calluses or try to remove them with medicine.  If you  find a minor scrape, cut, or break in the skin on your feet, keep it and the skin around it clean and dry. You may clean these areas with mild soap and water. Do not clean the area with peroxide, alcohol, or iodine.  If you have a wound, scrape, corn, or callus on your foot, look at it several times a day to make sure it is healing and not infected. Check for: ? Redness, swelling, or pain. ? Fluid or blood. ? Warmth. ? Pus or a bad smell. General instructions  Do not cross your legs. This may decrease blood flow to your feet.  Do not use heating pads or hot water bottles on your feet. They may burn your skin. If you have lost feeling in your feet or legs, you may not know this is happening until it is too late.  Protect your feet from hot and cold by wearing shoes, such as at the beach or on hot pavement.  Schedule a complete foot exam at least once a year (annually) or more often if you have foot problems. If you have foot problems, report any cuts, sores, or bruises to your health care provider immediately. Contact a health care provider if:  You have a medical condition that increases your risk of infection and you have any cuts, sores, or bruises on your feet.  You have an injury that is not   healing.  You have redness on your legs or feet.  You feel burning or tingling in your legs or feet.  You have pain or cramps in your legs and feet.  Your legs or feet are numb.  Your feet always feel cold.  You have pain around a toenail. Get help right away if:  You have a wound, scrape, corn, or callus on your foot and: ? You have pain, swelling, or redness that gets worse. ? You have fluid or blood coming from the wound, scrape, corn, or callus. ? Your wound, scrape, corn, or callus feels warm to the touch. ? You have pus or a bad smell coming from the wound, scrape, corn, or callus. ? You have a fever. ? You have a red line going up your leg. Summary  Check your feet every day  for cuts, sores, red spots, swelling, and blisters.  Moisturize feet and legs daily.  Wear shoes that fit properly and have enough cushioning.  If you have foot problems, report any cuts, sores, or bruises to your health care provider immediately.  Schedule a complete foot exam at least once a year (annually) or more often if you have foot problems. This information is not intended to replace advice given to you by your health care provider. Make sure you discuss any questions you have with your health care provider. Document Released: 10/25/2000 Document Revised: 12/10/2017 Document Reviewed: 11/29/2016 Elsevier Patient Education  2020 Elsevier Inc.  

## 2019-10-04 NOTE — Progress Notes (Signed)
Subjective:     Patient ID: Brandon Walls , male    DOB: [birth year] , 53 y.o.   MRN: [id number]   Chief Complaint  Patient presents with  . Wilder Glade f/u    HPI  He presents today for f/u diabetes. He was started on Farxiga '10mg'$  after his last visit. He does not wish to be on rx meds; however, he did acknowledge the need to get his sugars under control. He has tolerated this medication without any issues. He has noticed urinary frequency. He denies lightheadedness. He admits to drinking lots of water daily.     Past Medical History:  Diagnosis Date  . Hyperlipidemia      Family History  Problem Relation Age of Onset  . Cancer Mother        Lymphoma  . Coronary artery disease Mother 76       small MI, no stents  . Dementia Maternal Grandmother   . ALS Brother        half-brother     Current Outpatient Medications:  .  Accu-Chek FastClix Lancets MISC, Use as directed to check blood sugars 1 time per day dx: e11.22, Disp: 100 each, Rfl: 2 .  ACCU-CHEK SMARTVIEW test strip, , Disp: , Rfl:  .  Blood Glucose Monitoring Suppl (ACCU-CHEK NANO SMARTVIEW) W/DEVICE KIT, Use to check blood sugar daily.  Dx: E11.9, Disp: 1 kit, Rfl: 0 .  dapagliflozin propanediol (FARXIGA) 10 MG TABS tablet, Take 10 mg by mouth daily., Disp: 30 tablet, Rfl: 3 .  glucose blood test strip, Use to check blood sugars twice daily. Dx code:e11.9, Disp: 100 each, Rfl: 12   No Known Allergies   Review of Systems  Constitutional: Negative.   Respiratory: Negative.   Cardiovascular: Negative.   Gastrointestinal: Negative.   Neurological: Negative.   Psychiatric/Behavioral: Negative.      Today's Vitals   10/04/19 1556  BP: 124/82  Pulse: 80  Temp: 97.8 F (36.6 C)  TempSrc: Oral  Weight: 232 lb 12.8 oz (105.6 kg)  Height: 6' 3.2" (1.91 m)   Body mass index is 28.94 kg/m.   Objective:  Physical Exam Vitals signs and nursing note reviewed.  Constitutional:      Appearance: Normal  appearance.  Cardiovascular:     Rate and Rhythm: Normal rate and regular rhythm.     Heart sounds: Normal heart sounds.  Pulmonary:     Effort: Pulmonary effort is normal.     Breath sounds: Normal breath sounds.  Skin:    General: Skin is warm.  Neurological:     General: No focal deficit present.     Mental Status: He is alert.  Psychiatric:        Mood and Affect: Mood normal.         Assessment And Plan:     1. Uncontrolled type 2 diabetes mellitus with hyperglycemia ([facility])  Chronic. Last hba1c reviewed. Pt advised of goal hba1c less than 7.0. He will rto in Jan 2021 for his next a1c check. He will continue with [place] '10mg'$  daily. I will check a bmet today.   2. Sickle-cell trait (HCC)  Chronic, yet stable. Again, importance of adequate hydration was discussed with the patient.   3. Personal history of colonic polyps  Last colonoscopy results were reviewed. He is due in 2023 for repeat testing.   4. Drug therapy  - BMP8+EGFR        Maximino Greenland, MD    THE PATIENT  IS ENCOURAGED TO PRACTICE SOCIAL DISTANCING DUE TO THE COVID-19 PANDEMIC.

## 2019-10-05 LAB — BMP8+EGFR
BUN/Creatinine Ratio: 14 (ref 9–20)
BUN: 16 mg/dL (ref 6–24)
CO2: 22 mmol/L (ref 20–29)
Calcium: 9.5 mg/dL (ref 8.7–10.2)
Chloride: 104 mmol/L (ref 96–106)
Creatinine, Ser: 1.12 mg/dL (ref 0.76–1.27)
GFR calc Af Amer: 86 mL/min/{1.73_m2} (ref >59)
GFR calc non Af Amer: 75 mL/min/{1.73_m2} (ref >59)
Glucose: 193 mg/dL — ABNORMAL HIGH (ref 65–99)
Potassium: 4.3 mmol/L (ref 3.5–5.2)
Sodium: 142 mmol/L (ref 134–144)

## 2019-11-30 ENCOUNTER — Encounter: Payer: Self-pay | Admitting: Internal Medicine

## 2019-11-30 ENCOUNTER — Other Ambulatory Visit: Payer: Self-pay

## 2019-11-30 ENCOUNTER — Ambulatory Visit: Payer: BC Managed Care – PPO | Admitting: Internal Medicine

## 2019-11-30 VITALS — BP 136/90 | HR 71 | Temp 97.9°F | Ht 75.0 in | Wt 229.2 lb

## 2019-11-30 DIAGNOSIS — Z23 Encounter for immunization: Secondary | ICD-10-CM | POA: Diagnosis not present

## 2019-11-30 DIAGNOSIS — Z1211 Encounter for screening for malignant neoplasm of colon: Secondary | ICD-10-CM | POA: Diagnosis not present

## 2019-11-30 DIAGNOSIS — Z79899 Other long term (current) drug therapy: Secondary | ICD-10-CM | POA: Diagnosis not present

## 2019-11-30 DIAGNOSIS — D573 Sickle-cell trait: Secondary | ICD-10-CM

## 2019-11-30 DIAGNOSIS — E1165 Type 2 diabetes mellitus with hyperglycemia: Secondary | ICD-10-CM

## 2019-11-30 DIAGNOSIS — R030 Elevated blood-pressure reading, without diagnosis of hypertension: Secondary | ICD-10-CM

## 2019-11-30 DIAGNOSIS — Z0000 Encounter for general adult medical examination without abnormal findings: Secondary | ICD-10-CM | POA: Diagnosis not present

## 2019-11-30 LAB — POCT URINALYSIS DIPSTICK
Bilirubin, UA: NEGATIVE
Blood, UA: NEGATIVE
Glucose, UA: NEGATIVE
Ketones, UA: NEGATIVE
Leukocytes, UA: NEGATIVE
Nitrite, UA: NEGATIVE
Protein, UA: NEGATIVE
Spec Grav, UA: 1.02 (ref 1.010–1.025)
Urobilinogen, UA: 0.2 E.U./dL
pH, UA: 5.5 (ref 5.0–8.0)

## 2019-11-30 LAB — POCT UA - MICROALBUMIN
Albumin/Creatinine Ratio, Urine, POC: <30
Creatinine, POC: 100 mg/dL
Microalbumin Ur, POC: 10 mg/L

## 2019-11-30 LAB — POC HEMOCCULT BLD/STL (OFFICE/1-CARD/DIAGNOSTIC): Fecal Occult Blood, POC: NEGATIVE

## 2019-11-30 MED ORDER — ACCU-CHEK SMARTVIEW VI STRP: ORAL_STRIP | 3 refills | Status: DC

## 2019-11-30 NOTE — Patient Instructions (Signed)

## 2019-11-30 NOTE — Progress Notes (Signed)
This visit occurred during the SARS-CoV-2 public health emergency.  Safety protocols were in place, including screening questions prior to the visit, additional usage of staff PPE, and extensive cleaning of exam room while observing appropriate contact time as indicated for disinfecting solutions.  Subjective:     Patient ID: Brandon Walls , male    DOB: January 17, 1966 , 54 y.o.   MRN: [id number]   Chief Complaint  Patient presents with  . Annual Exam  . Diabetes    HPI  He is here today for a full physical examination. He has no specific concerns or complaints at this time. He reports compliance with meds.   Diabetes He presents for his follow-up diabetic visit. He has type 2 diabetes mellitus. There are no hypoglycemic associated symptoms. There are no diabetic associated symptoms. There are no hypoglycemic complications. Risk factors for coronary artery disease include diabetes mellitus, dyslipidemia and male sex. Current diabetic treatment includes oral agent (monotherapy). He is compliant with treatment most of the time. He is following a diabetic diet. An ACE inhibitor/angiotensin II receptor blocker is not being taken. Eye exam is current.     Past Medical History:  Diagnosis Date  . Diabetes ([place])   . Hyperlipidemia      Family History  Problem Relation Age of Onset  . Stroke Father   . Cancer Mother        Lymphoma  . Coronary artery disease Mother 52       small MI, no stents  . Dementia Maternal Grandmother   . ALS Brother        half-brother     Current Outpatient Medications:  .  Accu-Chek FastClix Lancets MISC, Use as directed to check blood sugars 1 time per day dx: e11.22, Disp: 100 each, Rfl: 2 .  Blood Glucose Monitoring Suppl (ACCU-CHEK NANO SMARTVIEW) W/DEVICE KIT, Use to check blood sugar daily.  Dx: E11.9, Disp: 1 kit, Rfl: 0 .  dapagliflozin propanediol (FARXIGA) 10 MG TABS tablet, Take 10 mg by mouth daily., Disp: 30 tablet, Rfl: 3 .  glucose blood  (ACCU-CHEK SMARTVIEW) test strip, Use as directed to check blood sugars 1 time per day dx: e11.22, Disp: 100 each, Rfl: 3 .  glucose blood test strip, Use to check blood sugars twice daily. Dx code:e11.9, Disp: 100 each, Rfl: 12   No Known Allergies   Men's preventive visit. Patient Health Questionnaire (PHQ-2) is    Office Visit from 08/23/2019 in [facility]  PHQ-2 Total Score  0    . Patient is on a healthy diet. Marital status: Married. Relevant history for alcohol use is:  Social History   Substance and Sexual Activity  Alcohol Use Yes  . Alcohol/week: 0.0 standard drinks   Comment: 1-2 beer once a week  . Relevant history for tobacco use is:  Social History   Tobacco Use  Smoking Status Never Smoker  Smokeless Tobacco Never Used  .  Review of Systems  Constitutional: Negative.   HENT: Negative.   Eyes: Negative.   Respiratory: Negative.   Cardiovascular: Negative.   Endocrine: Negative.   Genitourinary: Negative.   Musculoskeletal: Negative.   Skin: Negative.   Allergic/Immunologic: Negative.   Neurological: Negative.   Hematological: Negative.   Psychiatric/Behavioral: Negative.      Today's Vitals   11/30/19 1014  BP: 136/90  Pulse: 71  Temp: 97.9 F (36.6 C)  TempSrc: Oral  Weight: 229 lb 3.2 oz (104 kg)  Height: '6\' 3"'  (  1.905 m)   Body mass index is 28.65 kg/m.   Objective:  Physical Exam Vitals and nursing note reviewed.  Constitutional:      Appearance: Normal appearance.  HENT:     Head: Normocephalic and atraumatic.     Right Ear: Tympanic membrane, ear canal and external ear normal.     Left Ear: Tympanic membrane, ear canal and external ear normal.     Nose:     Comments: Deferred, masked    Mouth/Throat:     Comments: Deferred, masked Eyes:     Extraocular Movements: Extraocular movements intact.     Conjunctiva/sclera: Conjunctivae normal.     Pupils: Pupils are equal, round, and reactive to light.   Cardiovascular:     Rate and Rhythm: Normal rate and regular rhythm.     Pulses: Normal pulses.          Dorsalis pedis pulses are 2+ on the right side and 2+ on the left side.     Heart sounds: Normal heart sounds.  Pulmonary:     Effort: Pulmonary effort is normal.     Breath sounds: Normal breath sounds.  Chest:     Breasts:        Right: Normal. No swelling, bleeding, inverted nipple, mass or nipple discharge.        Left: Normal. No swelling, bleeding, inverted nipple, mass or nipple discharge.  Abdominal:     General: Abdomen is flat. Bowel sounds are normal.     Palpations: Abdomen is soft.  Genitourinary:    Prostate: Normal.     Rectum: Normal. Guaiac result negative.  Musculoskeletal:        General: Normal range of motion.     Cervical back: Normal range of motion and neck supple.  Feet:     Right foot:     Protective Sensation: 5 sites tested. 5 sites sensed.     Skin integrity: Skin integrity normal.     Toenail Condition: Right toenails are normal.     Left foot:     Protective Sensation: 5 sites tested. 5 sites sensed.     Skin integrity: Skin integrity normal.     Toenail Condition: Left toenails are normal.  Skin:    General: Skin is warm.  Neurological:     General: No focal deficit present.     Mental Status: He is alert.  Psychiatric:        Mood and Affect: Mood normal.        Behavior: Behavior normal.         Assessment And Plan:     1. Routine general medical examination at health care facility  A full exam was performed.  DRE performed, stool heme negative.  PATIENT IS ADVISED TO GET 30-45 MINUTES REGULAR EXERCISE NO LESS THAN FOUR TO FIVE DAYS PER WEEK - BOTH WEIGHTBEARING EXERCISES AND AEROBIC ARE RECOMMENDED.  HE IS ADVISED TO FOLLOW A HEALTHY DIET WITH AT LEAST SIX FRUITS/VEGGIES PER DAY, DECREASE INTAKE OF RED MEAT, AND TO INCREASE FISH INTAKE TO TWO DAYS PER WEEK.  MEATS/FISH SHOULD NOT BE FRIED, BAKED OR BROILED IS PREFERABLE.  I SUGGEST  WEARING SPF 50 SUNSCREEN ON EXPOSED PARTS AND ESPECIALLY WHEN IN THE DIRECT SUNLIGHT FOR AN EXTENDED PERIOD OF TIME.  PLEASE AVOID FAST FOOD RESTAURANTS AND INCREASE YOUR WATER INTAKE.  - POC Hemoccult Bld/Stl (1-Cd Office Dx) - CMP14+EGFR - CBC - Lipid panel - Hemoglobin A1c - PSA  2. Uncontrolled type 2 diabetes  mellitus with hyperglycemia ([place])  Diabetic foot exam was performed.  We discussed need for renal protection, treatment for hyperlipidemia and asprin therapy. He would like to think more about this, prior to starting new medications. I DISCUSSED WITH THE PATIENT AT LENGTH REGARDING THE GOALS OF GLYCEMIC CONTROL AND POSSIBLE LONG-TERM COMPLICATIONS.  I  ALSO STRESSED THE IMPORTANCE OF COMPLIANCE WITH HOME GLUCOSE MONITORING, DIETARY RESTRICTIONS INCLUDING AVOIDANCE OF SUGARY DRINKS/PROCESSED FOODS,  ALONG WITH REGULAR EXERCISE.  I  ALSO STRESSED THE IMPORTANCE OF ANNUAL EYE EXAMS, SELF FOOT CARE AND COMPLIANCE WITH OFFICE VISITS.  - POCT Urinalysis Dipstick (81002) - POCT UA - Microalbumin - EKG 12-Lead  3. Elevated blood pressure reading  He is encouraged to avoid adding salt to his foods, and encouraged to cut back on intake of packaged foods which tend to be high in sodium. I will re-evaluate at his next visit. He would benefit from ACE inhibitor vs. ARB therapy, but he does not wish to start at this time. Will check urine microalbumin as well.   4. Sickle-cell trait (HCC)  Chronic, yet stable. He is currently asymptomatic.   5. Need for vaccination  - Tdap vaccine greater than or equal to 7yo IM     Maximino Greenland, MD    THE PATIENT IS ENCOURAGED TO PRACTICE SOCIAL DISTANCING DUE TO THE COVID-19 PANDEMIC.

## 2019-12-01 LAB — CMP14+EGFR
ALT: 20 IU/L (ref 0–44)
AST: 16 IU/L (ref 0–40)
Albumin/Globulin Ratio: 1.9 (ref 1.2–2.2)
Albumin: 4.7 g/dL (ref 3.8–4.9)
Alkaline Phosphatase: 60 IU/L (ref 39–117)
BUN/Creatinine Ratio: 11 (ref 9–20)
BUN: 12 mg/dL (ref 6–24)
Bilirubin Total: 0.5 mg/dL (ref 0.0–1.2)
CO2: 25 mmol/L (ref 20–29)
Calcium: 9.7 mg/dL (ref 8.7–10.2)
Chloride: 105 mmol/L (ref 96–106)
Creatinine, Ser: 1.12 mg/dL (ref 0.76–1.27)
GFR calc Af Amer: 86 mL/min/{1.73_m2} (ref >59)
GFR calc non Af Amer: 74 mL/min/{1.73_m2} (ref >59)
Globulin, Total: 2.5 g/dL (ref 1.5–4.5)
Glucose: 133 mg/dL — ABNORMAL HIGH (ref 65–99)
Potassium: 4.6 mmol/L (ref 3.5–5.2)
Sodium: 145 mmol/L — ABNORMAL HIGH (ref 134–144)
Total Protein: 7.2 g/dL (ref 6.0–8.5)

## 2019-12-01 LAB — CBC
Hematocrit: 50.1 % (ref 37.5–51.0)
Hemoglobin: 16.3 g/dL (ref 13.0–17.7)
MCH: 27.7 pg (ref 26.6–33.0)
MCHC: 32.5 g/dL (ref 31.5–35.7)
MCV: 85 fL (ref 79–97)
Platelets: 180 10*3/uL (ref 150–450)
RBC: 5.88 x10E6/uL — ABNORMAL HIGH (ref 4.14–5.80)
RDW: 12.9 % (ref 11.6–15.4)
WBC: 3.4 10*3/uL (ref 3.4–10.8)

## 2019-12-01 LAB — LIPID PANEL
Chol/HDL Ratio: 5 ratio (ref 0.0–5.0)
Cholesterol, Total: 190 mg/dL (ref 100–199)
HDL: 38 mg/dL — ABNORMAL LOW (ref >39)
LDL Chol Calc (NIH): 136 mg/dL — ABNORMAL HIGH (ref 0–99)
Triglycerides: 86 mg/dL (ref 0–149)
VLDL Cholesterol Cal: 16 mg/dL (ref 5–40)

## 2019-12-01 LAB — HEMOGLOBIN A1C
Est. average glucose Bld gHb Est-mCnc: 163 mg/dL
Hgb A1c MFr Bld: 7.3 % — ABNORMAL HIGH (ref 4.8–5.6)

## 2019-12-01 LAB — PSA: Prostate Specific Ag, Serum: 1.1 ng/mL (ref 0.0–4.0)

## 2020-01-28 ENCOUNTER — Ambulatory Visit: Payer: Self-pay | Attending: Internal Medicine

## 2020-01-28 DIAGNOSIS — Z23 Encounter for immunization: Secondary | ICD-10-CM

## 2020-01-28 NOTE — Progress Notes (Signed)
   Covid-19 Vaccination Clinic  Name:  Brandon Walls    MRN: [id number] DOB: July 07, 1966  01/28/2020  Mr. Bua was observed post Covid-19 immunization for 15 minutes without incident. He was provided with Vaccine Information Sheet and instruction to access the V-Safe system.   Mr. Cygan was instructed to call 911 with any severe reactions post vaccine: Marland Kitchen Difficulty breathing  . Swelling of face and throat  . A fast heartbeat  . A bad rash all over body  . Dizziness and weakness   Immunizations Administered    Name Date Dose VIS Date Route   Pfizer COVID-19 Vaccine 01/28/2020  6:20 PM 0.3 mL 10/22/2019 Intramuscular   Manufacturer: [employer], [employer]   Lot: [id number]   NDC: [address]-9

## 2020-02-18 ENCOUNTER — Ambulatory Visit: Payer: Self-pay | Attending: Internal Medicine

## 2020-02-18 DIAGNOSIS — Z23 Encounter for immunization: Secondary | ICD-10-CM

## 2020-02-18 NOTE — Progress Notes (Signed)
   Covid-19 Vaccination Clinic  Name:  Brandon Walls    MRN: [id number] DOB: 1966/11/04  02/18/2020  [name] was observed post Covid-19 immunization for 15 minutes without incident. He was provided with Vaccine Information Sheet and instruction to access the V-Safe system.   [name] was instructed to call 911 with any severe reactions post vaccine: Marland Kitchen Difficulty breathing  . Swelling of face and throat  . A fast heartbeat  . A bad rash all over body  . Dizziness and weakness   Immunizations Administered    Name Date Dose VIS Date Route   Pfizer COVID-19 Vaccine 02/18/2020  5:56 PM 0.3 mL 10/22/2019 Intramuscular   Manufacturer: [employer], [employer]   Lot: [id number]   NDC: [address]-9

## 2020-02-28 ENCOUNTER — Ambulatory Visit: Payer: BC Managed Care – PPO | Admitting: Internal Medicine

## 2020-09-18 ENCOUNTER — Ambulatory Visit: Payer: BC Managed Care – PPO | Admitting: Internal Medicine

## 2020-09-18 ENCOUNTER — Other Ambulatory Visit: Payer: Self-pay

## 2020-09-18 ENCOUNTER — Encounter: Payer: Self-pay | Admitting: Internal Medicine

## 2020-09-18 VITALS — BP 118/78 | HR 73 | Temp 98.2°F | Ht 75.0 in | Wt 228.0 lb

## 2020-09-18 DIAGNOSIS — H9311 Tinnitus, right ear: Secondary | ICD-10-CM | POA: Diagnosis not present

## 2020-09-18 DIAGNOSIS — E1165 Type 2 diabetes mellitus with hyperglycemia: Secondary | ICD-10-CM

## 2020-09-18 DIAGNOSIS — H6121 Impacted cerumen, right ear: Secondary | ICD-10-CM | POA: Diagnosis not present

## 2020-09-18 DIAGNOSIS — Z1159 Encounter for screening for other viral diseases: Secondary | ICD-10-CM

## 2020-09-18 DIAGNOSIS — E7800 Pure hypercholesterolemia, unspecified: Secondary | ICD-10-CM

## 2020-09-18 NOTE — Progress Notes (Signed)
I,Erica T Jackson,acting as a scribe for Robyn N Sanders, MD.,have documented all relevant documentation on the behalf of Robyn N Sanders, MD,as directed by  Robyn N Sanders, MD while in the presence of Robyn N Sanders, MD. This visit occurred during the SARS-CoV-2 public health emergency.  Safety protocols were in place, including screening questions prior to the visit, additional usage of staff PPE, and extensive cleaning of exam room while observing appropriate contact time as indicated for disinfecting solutions.  Subjective:     Patient ID: Brandon Walls , male    DOB: 02/04/1966 , 54 y.o.   MRN: [id number]   Chief Complaint  Patient presents with  . Tinnitus    HPI  Patient here for ringing in his right ear. He reports his sx started about 3 weeks ago. He denies hearing loss. He typically awakens to a ringing noise in his ear. He denies ear pain.     Past Medical History:  Diagnosis Date  . Diabetes ([facility])   . Hyperlipidemia      Family History  Problem Relation Age of Onset  . Stroke Father   . Cancer Mother        Lymphoma  . Coronary artery disease Mother 62       small MI, no stents  . Dementia Maternal Grandmother   . ALS Brother        half-brother     Current Outpatient Medications:  .  Accu-Chek FastClix Lancets MISC, Use as directed to check blood sugars 1 time per day dx: e11.22, Disp: 100 each, Rfl: 2 .  Blood Glucose Monitoring Suppl (ACCU-CHEK NANO SMARTVIEW) W/DEVICE KIT, Use to check blood sugar daily.  Dx: E11.9, Disp: 1 kit, Rfl: 0 .  dapagliflozin propanediol (FARXIGA) 10 MG TABS tablet, Take 10 mg by mouth daily., Disp: 30 tablet, Rfl: 3 .  glucose blood (ACCU-CHEK SMARTVIEW) test strip, Use as directed to check blood sugars 1 time per day dx: e11.22, Disp: 100 each, Rfl: 3 .  glucose blood test strip, Use to check blood sugars twice daily. Dx code:e11.9, Disp: 100 each, Rfl: 12   No Known Allergies   Review of Systems  Constitutional:  Negative.  Negative for fatigue.  HENT: Positive for tinnitus.   Respiratory: Negative.   Endocrine: Negative for polydipsia, polyphagia and polyuria.  Musculoskeletal: Negative.   Skin: Negative.   Neurological: Negative for dizziness and headaches.  Psychiatric/Behavioral: Negative.      Today's Vitals   09/18/20 0931  BP: 118/78  Pulse: 73  Temp: 98.2 F (36.8 C)  TempSrc: Oral  Weight: 228 lb (103.4 kg)  Height: 6' 3" (1.905 m)  PainSc: 3   PainLoc: Knee   Body mass index is 28.5 kg/m.   Objective:  Physical Exam Vitals and nursing note reviewed.  Constitutional:      Appearance: Normal appearance.  HENT:     Right Ear: Ear canal and external ear normal. There is impacted cerumen.     Left Ear: Tympanic membrane, ear canal and external ear normal. There is no impacted cerumen.  Cardiovascular:     Rate and Rhythm: Normal rate and regular rhythm.     Heart sounds: Normal heart sounds.  Pulmonary:     Effort: Pulmonary effort is normal.     Breath sounds: Normal breath sounds.  Skin:    General: Skin is warm.  Neurological:     General: No focal deficit present.     Mental Status: He is   alert.  Psychiatric:        Mood and Affect: Mood normal.         Assessment And Plan:     1. Tinnitus, right ear Comments: Pt has cerumen impaction. He agrees to have ears flushed today. If his symptoms persist, he agrees to ENT referral in [place].   2. Impacted cerumen of right ear  AFTER OBTAINING VERBAL CONSENT, RIGHT EAR WAS FLUSHED BY IRRIGATION. HE TOLERATED PROCEDURE WELL WITHOUT ANY COMPLICATIONS. NO TM ABNORMALITIES WERE NOTED.  3. Uncontrolled type 2 diabetes mellitus with hyperglycemia ([place]) Comments: He admits to non-compliance with daily use of meds. He takes meds three days weekly. I will check labs as listed below. I will adjust meds as needed.  - Hemoglobin A1c - CMP14+EGFR  4. Pure hypercholesterolemia Comments: I did review Jan 2021 labs, LDL 136  - pt advised of goal LDL less than 70.  He does not wish to start rx medication. He prefers to work on lifestyle modificatio  5. Encounter for HCV screening test for low risk patient Comments: I will check HCV antibody.  - Hepatitis C antibody     Patient was given opportunity to ask questions. Patient verbalized understanding of the plan and was able to repeat key elements of the plan. All questions were answered to their satisfaction.  Maximino Greenland, MD   I, Maximino Greenland, MD, have reviewed all documentation for this visit. The documentation on 09/18/20 for the exam, diagnosis, procedures, and orders are all accurate and complete.  THE PATIENT IS ENCOURAGED TO PRACTICE SOCIAL DISTANCING DUE TO THE COVID-19 PANDEMIC.

## 2020-09-18 NOTE — Patient Instructions (Signed)

## 2020-09-19 LAB — HEMOGLOBIN A1C
Est. average glucose Bld gHb Est-mCnc: 180 mg/dL
Hgb A1c MFr Bld: 7.9 % — ABNORMAL HIGH (ref 4.8–5.6)

## 2020-09-19 LAB — CMP14+EGFR
ALT: 15 IU/L (ref 0–44)
AST: 12 IU/L (ref 0–40)
Albumin/Globulin Ratio: 1.8 (ref 1.2–2.2)
Albumin: 4.7 g/dL (ref 3.8–4.9)
Alkaline Phosphatase: 61 IU/L (ref 44–121)
BUN/Creatinine Ratio: 13 (ref 9–20)
BUN: 15 mg/dL (ref 6–24)
Bilirubin Total: 1 mg/dL (ref 0.0–1.2)
CO2: 24 mmol/L (ref 20–29)
Calcium: 9.9 mg/dL (ref 8.7–10.2)
Chloride: 103 mmol/L (ref 96–106)
Creatinine, Ser: 1.12 mg/dL (ref 0.76–1.27)
GFR calc Af Amer: 86 mL/min/{1.73_m2} (ref >59)
GFR calc non Af Amer: 74 mL/min/{1.73_m2} (ref >59)
Globulin, Total: 2.6 g/dL (ref 1.5–4.5)
Glucose: 170 mg/dL — ABNORMAL HIGH (ref 65–99)
Potassium: 5 mmol/L (ref 3.5–5.2)
Sodium: 141 mmol/L (ref 134–144)
Total Protein: 7.3 g/dL (ref 6.0–8.5)

## 2020-09-19 LAB — HEPATITIS C ANTIBODY: Hep C Virus Ab: <0.1 s/co ratio (ref 0.0–0.9)

## 2020-09-25 ENCOUNTER — Encounter: Payer: Self-pay | Admitting: Internal Medicine

## 2020-09-25 ENCOUNTER — Other Ambulatory Visit: Payer: Self-pay

## 2020-09-25 DIAGNOSIS — H9311 Tinnitus, right ear: Secondary | ICD-10-CM

## 2020-10-09 ENCOUNTER — Other Ambulatory Visit: Payer: Self-pay

## 2020-10-09 ENCOUNTER — Encounter (INDEPENDENT_AMBULATORY_CARE_PROVIDER_SITE_OTHER): Payer: Self-pay | Admitting: Otolaryngology

## 2020-10-09 ENCOUNTER — Ambulatory Visit (INDEPENDENT_AMBULATORY_CARE_PROVIDER_SITE_OTHER): Payer: BC Managed Care – PPO | Admitting: Otolaryngology

## 2020-10-09 VITALS — Temp 97.5°F

## 2020-10-09 DIAGNOSIS — H9313 Tinnitus, bilateral: Secondary | ICD-10-CM

## 2020-10-09 DIAGNOSIS — H903 Sensorineural hearing loss, bilateral: Secondary | ICD-10-CM

## 2020-10-09 NOTE — Progress Notes (Signed)
HPI: Brandon Walls is a 54 y.o. male who presents is referred by his PCP Dr. Baird Cancer for evaluation of new onset tinnitus that began over the past month or 2.  He feels like he hears it in both ears but may be worse in the right ear.  He denies any trouble hearing.  He denies any recent loud noise exposure..  Past Medical History:  Diagnosis Date  . Diabetes ([place])   . Hyperlipidemia    Past Surgical History:  Procedure Laterality Date  . exercise treadmill  11/2010   WNL  . SINUS SURGERY WITH INSTATRAK  2005   Social History   Socioeconomic History  . Marital status: Married    Spouse name: Not on file  . Number of children: 3  . Years of education: Not on file  . Highest education level: Not on file  Occupational History  . Occupation: Designer, industrial/product: [employer]  Tobacco Use  . Smoking status: Never Smoker  . Smokeless tobacco: Never Used  Vaping Use  . Vaping Use: Never used  Substance and Sexual Activity  . Alcohol use: Yes    Alcohol/week: 0.0 standard drinks    Comment: 1-2 beer once a week  . Drug use: No  . Sexual activity: Not on file  Other Topics Concern  . Not on file  Social History Narrative   Regular exercise-yes, 2 times a week   Diet; some fast food, fruits and veggies, water   Social Determinants of Health   Financial Resource Strain:   . Difficulty of Paying Living Expenses: Not on file  Food Insecurity:   . Worried About Charity fundraiser in the Last Year: Not on file  . Ran Out of Food in the Last Year: Not on file  Transportation Needs:   . Lack of Transportation (Medical): Not on file  . Lack of Transportation (Non-Medical): Not on file  Physical Activity:   . Days of Exercise per Week: Not on file  . Minutes of Exercise per Session: Not on file  Stress:   . Feeling of Stress : Not on file  Social Connections:   . Frequency of Communication with Friends and Family: Not on file  . Frequency of Social Gatherings  with Friends and Family: Not on file  . Attends Religious Services: Not on file  . Active Member of Clubs or Organizations: Not on file  . Attends Archivist Meetings: Not on file  . Marital Status: Not on file   Family History  Problem Relation Age of Onset  . Stroke Father   . Cancer Mother        Lymphoma  . Coronary artery disease Mother 30       small MI, no stents  . Dementia Maternal Grandmother   . ALS Brother        half-brother   No Known Allergies Prior to Admission medications   Medication Sig Start Date End Date Taking? Authorizing Provider  Accu-Chek FastClix Lancets MISC Use as directed to check blood sugars 1 time per day dx: e11.22 10/04/19  Yes Glendale Chard, MD  Blood Glucose Monitoring Suppl (ACCU-CHEK NANO SMARTVIEW) W/DEVICE KIT Use to check blood sugar daily.  Dx: E11.9 12/08/14  Yes Bedsole, Amy E, MD  dapagliflozin propanediol (FARXIGA) 10 MG TABS tablet Take 10 mg by mouth daily. 10/04/19  Yes Glendale Chard, MD  glucose blood (ACCU-CHEK SMARTVIEW) test strip Use as directed to check blood  sugars 1 time per day dx: e11.22 11/30/19  Yes Glendale Chard, MD  glucose blood test strip Use to check blood sugars twice daily. Dx code:e11.9 08/23/19  Yes Glendale Chard, MD     Positive ROS: Otherwise negative  All other systems have been reviewed and were otherwise negative with the exception of those mentioned in the HPI and as above.  Physical Exam: Constitutional: Alert, well-appearing, no acute distress Ears: External ears without lesions or tenderness. Ear canals are clear bilaterally.  TMs are clear bilaterally. Nasal: External nose without lesions. Septum midline with mild rhinitis. Clear nasal passages Oral: Lips and gums without lesions. Tongue and palate mucosa without lesions. Posterior oropharynx clear. Neck: No palpable adenopathy or masses Respiratory: Breathing comfortably  Skin: No facial/neck lesions or rash noted.  Audiogram  demonstrated normal hearing in both ears up to 6000 frequency and then a drop in hearing in both ears at 8000 frequency.  SRT's are 5 and 10 dB in the right and left ear with type A tympanograms bilaterally. 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  Plan: Reviewed with patient concerning treatment options for tinnitus which are limited.  Discussed with him concerning using masking noise when the tinnitus is bothersome. He will follow-up if he notices any worsening of her tinnitus.   Radene Journey, MD   CC:

## 2020-10-12 ENCOUNTER — Encounter (INDEPENDENT_AMBULATORY_CARE_PROVIDER_SITE_OTHER): Payer: Self-pay

## 2020-12-05 ENCOUNTER — Encounter: Payer: Self-pay | Admitting: Internal Medicine

## 2020-12-05 ENCOUNTER — Other Ambulatory Visit: Payer: Self-pay

## 2020-12-05 ENCOUNTER — Ambulatory Visit: Payer: BC Managed Care – PPO | Admitting: Internal Medicine

## 2020-12-05 VITALS — BP 124/70 | HR 74 | Temp 97.7°F | Ht 77.0 in | Wt 225.4 lb

## 2020-12-05 DIAGNOSIS — D573 Sickle-cell trait: Secondary | ICD-10-CM | POA: Diagnosis not present

## 2020-12-05 DIAGNOSIS — Z0000 Encounter for general adult medical examination without abnormal findings: Secondary | ICD-10-CM

## 2020-12-05 DIAGNOSIS — E7800 Pure hypercholesterolemia, unspecified: Secondary | ICD-10-CM

## 2020-12-05 DIAGNOSIS — E1165 Type 2 diabetes mellitus with hyperglycemia: Secondary | ICD-10-CM | POA: Diagnosis not present

## 2020-12-05 DIAGNOSIS — R030 Elevated blood-pressure reading, without diagnosis of hypertension: Secondary | ICD-10-CM

## 2020-12-05 LAB — POCT URINALYSIS DIPSTICK
Bilirubin, UA: NEGATIVE
Blood, UA: NEGATIVE
Glucose, UA: POSITIVE — AB
Ketones, UA: NEGATIVE
Leukocytes, UA: NEGATIVE
Nitrite, UA: NEGATIVE
Protein, UA: NEGATIVE
Spec Grav, UA: 1.015 (ref 1.010–1.025)
Urobilinogen, UA: 0.2 E.U./dL
pH, UA: 6 (ref 5.0–8.0)

## 2020-12-05 LAB — POCT UA - MICROALBUMIN
Albumin/Creatinine Ratio, Urine, POC: <30
Creatinine, POC: 200 mg/dL
Microalbumin Ur, POC: 10 mg/L

## 2020-12-05 LAB — POC HEMOCCULT BLD/STL (OFFICE/1-CARD/DIAGNOSTIC): Fecal Occult Blood, POC: NEGATIVE

## 2020-12-05 MED ORDER — ACCU-CHEK SMARTVIEW VI STRP: ORAL_STRIP | 3 refills | Status: DC

## 2020-12-05 NOTE — Patient Instructions (Signed)

## 2020-12-05 NOTE — Progress Notes (Signed)
I,Katawbba Wiggins,acting as a Education administrator for Maximino Greenland, MD.,have documented all relevant documentation on the behalf of Maximino Greenland, MD,as directed by  Maximino Greenland, MD while in the presence of Maximino Greenland, MD.  This visit occurred during the SARS-CoV-2 public health emergency.  Safety protocols were in place, including screening questions prior to the visit, additional usage of staff PPE, and extensive cleaning of exam room while observing appropriate contact time as indicated for disinfecting solutions.  Subjective:     Patient ID: Brandon Walls , male    DOB: 07/21/1966 , 55 y.o.   MRN: [id number]   Chief Complaint  Patient presents with  . Annual Exam  . Diabetes    HPI  He is here today for a full physical examination. He agrees to have prostate exam today. He reports compliance with [place]. He is fully vaccinated; however, does not plan to get the booster. He plans to build his immune system with supplements instead.   Diabetes He presents for his follow-up diabetic visit. He has type 2 diabetes mellitus. There are no hypoglycemic associated symptoms. There are no diabetic associated symptoms. There are no hypoglycemic complications. Risk factors for coronary artery disease include diabetes mellitus, dyslipidemia and male sex. Current diabetic treatment includes oral agent (monotherapy). He is compliant with treatment most of the time. He is following a diabetic diet. An ACE inhibitor/angiotensin II receptor blocker is not being taken. Eye exam is current.     Past Medical History:  Diagnosis Date  . Diabetes ([place])   . Hyperlipidemia      Family History  Problem Relation Age of Onset  . Stroke Father   . Cancer Mother        Lymphoma  . Coronary artery disease Mother 47       small MI, no stents  . Dementia Maternal Grandmother   . ALS Brother        half-brother     Current Outpatient Medications:  .  Accu-Chek FastClix Lancets MISC, Use as  directed to check blood sugars 1 time per day dx: e11.22, Disp: 100 each, Rfl: 2 .  Blood Glucose Monitoring Suppl (ACCU-CHEK NANO SMARTVIEW) W/DEVICE KIT, Use to check blood sugar daily.  Dx: E11.9, Disp: 1 kit, Rfl: 0 .  dapagliflozin propanediol (FARXIGA) 10 MG TABS tablet, Take 10 mg by mouth daily., Disp: 30 tablet, Rfl: 3 .  glucose blood (ACCU-CHEK SMARTVIEW) test strip, Use as directed to check blood sugars 1 time per day dx: e11.22, Disp: 100 each, Rfl: 3   No Known Allergies   Men's preventive visit. Patient Health Questionnaire (PHQ-2) is  Flowsheet Row Office Visit from 09/18/2020 in [facility]  PHQ-2 Total Score 0    . Patient is on a healthy diet. Marital status: Married. Relevant history for alcohol use is:  Social History   Substance and Sexual Activity  Alcohol Use Yes  . Alcohol/week: 0.0 standard drinks   Comment: 1-2 beer once a week  . Relevant history for tobacco use is:  Social History   Tobacco Use  Smoking Status Never Smoker  Smokeless Tobacco Never Used  .   Review of Systems  Constitutional: Negative.   HENT: Negative.   Eyes: Negative.   Respiratory: Negative.   Cardiovascular: Negative.   Gastrointestinal: Negative.   Endocrine: Negative.   Genitourinary: Negative.   Musculoskeletal: Negative.   Skin: Negative.   Allergic/Immunologic: Negative.   Neurological: Negative.   Hematological: Negative.  Psychiatric/Behavioral: Negative.      Today's Vitals   12/05/20 1106  BP: 124/70  Pulse: 74  Temp: 97.7 F (36.5 C)  TempSrc: Oral  Weight: 225 lb 6.4 oz (102.2 kg)  Height: 6\' 5"  (1.956 m)   Body mass index is 26.73 kg/m.  Wt Readings from Last 3 Encounters:  12/05/20 225 lb 6.4 oz (102.2 kg)  09/18/20 228 lb (103.4 kg)  11/30/19 229 lb 3.2 oz (104 kg)   Objective:  Physical Exam Vitals and nursing note reviewed.  Constitutional:      Appearance: Normal appearance. He is obese.  HENT:     Head:  Normocephalic and atraumatic.     Right Ear: Tympanic membrane, ear canal and external ear normal.     Left Ear: Tympanic membrane, ear canal and external ear normal.     Nose:     Comments: Deferred,masked    Mouth/Throat:     Comments: Deferred, masked Eyes:     Extraocular Movements: Extraocular movements intact.     Conjunctiva/sclera: Conjunctivae normal.     Pupils: Pupils are equal, round, and reactive to light.  Cardiovascular:     Rate and Rhythm: Normal rate and regular rhythm.     Pulses: Normal pulses.          Dorsalis pedis pulses are 2+ on the right side and 2+ on the left side.     Heart sounds: Normal heart sounds.  Pulmonary:     Effort: Pulmonary effort is normal.     Breath sounds: Normal breath sounds.  Chest:  Breasts:     Right: Normal. No swelling, bleeding, inverted nipple, mass or nipple discharge.     Left: Normal. No swelling, bleeding, inverted nipple, mass or nipple discharge.    Abdominal:     General: Bowel sounds are normal.     Palpations: Abdomen is soft.  Genitourinary:    Prostate: Normal.     Rectum: Normal. Guaiac result negative.  Musculoskeletal:        General: Normal range of motion.     Cervical back: Normal range of motion and neck supple.  Feet:     Right foot:     Protective Sensation: 5 sites tested. 5 sites sensed.     Skin integrity: Dry skin present.     Toenail Condition: Right toenails are long.     Left foot:     Protective Sensation: 5 sites tested. 5 sites sensed.     Skin integrity: Dry skin present.     Toenail Condition: Left toenails are long.  Skin:    General: Skin is warm.  Neurological:     General: No focal deficit present.     Mental Status: He is alert and oriented to person, place, and time.  Psychiatric:        Mood and Affect: Mood normal.        Behavior: Behavior normal.         Assessment And Plan:    1. Routine general medical examination at health care facility Comments: A full exam  was performed. DRE performed, stool heme negative. PATIENT IS ADVISED TO GET 30-45 MINUTES REGULAR EXERCISE NO LESS THAN FOUR TO FIVE DAYS PER WEEK - BOTH WEIGHTBEARING EXERCISES AND AEROBIC ARE RECOMMENDED.  PATIENT IS ADVISED TO FOLLOW A HEALTHY DIET WITH AT LEAST SIX FRUITS/VEGGIES PER DAY, DECREASE INTAKE OF RED MEAT, AND TO INCREASE FISH INTAKE TO TWO DAYS PER WEEK.  MEATS/FISH SHOULD NOT BE FRIED,  BAKED OR BROILED IS PREFERABLE.  I SUGGEST WEARING SPF 50 SUNSCREEN ON EXPOSED PARTS AND ESPECIALLY WHEN IN THE DIRECT SUNLIGHT FOR AN EXTENDED PERIOD OF TIME.  PLEASE AVOID FAST FOOD RESTAURANTS AND INCREASE YOUR WATER INTAKE.  - CBC - Hemoglobin A1c - PSA - Lipid panel - BMP8+EGFR - POC Hemoccult Bld/Stl (1-Cd Office Dx)  2. Uncontrolled type 2 diabetes mellitus with hyperglycemia ([place]) Comments: Diabetic foot exam was performed. I DISCUSSED WITH THE PATIENT AT LENGTH REGARDING THE GOALS OF GLYCEMIC CONTROL AND POSSIBLE LONG-TERM COMPLICATIONS.  I  ALSO STRESSED THE IMPORTANCE OF COMPLIANCE WITH HOME GLUCOSE MONITORING, DIETARY RESTRICTIONS INCLUDING AVOIDANCE OF SUGARY DRINKS/PROCESSED FOODS,  ALONG WITH REGULAR EXERCISE.  I  ALSO STRESSED THE IMPORTANCE OF ANNUAL EYE EXAMS, SELF FOOT CARE AND COMPLIANCE WITH OFFICE VISITS.  - POCT Urinalysis Dipstick (81002) - POCT UA - Microalbumin - EKG 12-Lead  3. Sickle-cell trait (HCC) Chronic, stable. He is encouraged to stay well hydrated.   Patient was given opportunity to ask questions. Patient verbalized understanding of the plan and was able to repeat key elements of the plan. All questions were answered to their satisfaction.  Maximino Greenland, MD   I, Maximino Greenland, MD, have reviewed all documentation for this visit. The documentation on 12/06/20 for the exam, diagnosis, procedures, and orders are all accurate and complete.  THE PATIENT IS ENCOURAGED TO PRACTICE SOCIAL DISTANCING DUE TO THE COVID-19 PANDEMIC.

## 2020-12-06 DIAGNOSIS — E1165 Type 2 diabetes mellitus with hyperglycemia: Secondary | ICD-10-CM | POA: Insufficient documentation

## 2020-12-06 LAB — LIPID PANEL
Chol/HDL Ratio: 5.8 ratio — ABNORMAL HIGH (ref 0.0–5.0)
Cholesterol, Total: 216 mg/dL — ABNORMAL HIGH (ref 100–199)
HDL: 37 mg/dL — ABNORMAL LOW (ref >39)
LDL Chol Calc (NIH): 148 mg/dL — ABNORMAL HIGH (ref 0–99)
Triglycerides: 168 mg/dL — ABNORMAL HIGH (ref 0–149)
VLDL Cholesterol Cal: 31 mg/dL (ref 5–40)

## 2020-12-06 LAB — HEMOGLOBIN A1C
Est. average glucose Bld gHb Est-mCnc: 177 mg/dL
Hgb A1c MFr Bld: 7.8 % — ABNORMAL HIGH (ref 4.8–5.6)

## 2020-12-06 LAB — BMP8+EGFR
BUN/Creatinine Ratio: 13 (ref 9–20)
BUN: 14 mg/dL (ref 6–24)
CO2: 25 mmol/L (ref 20–29)
Calcium: 9.8 mg/dL (ref 8.7–10.2)
Chloride: 99 mmol/L (ref 96–106)
Creatinine, Ser: 1.12 mg/dL (ref 0.76–1.27)
GFR calc Af Amer: 85 mL/min/{1.73_m2} (ref >59)
GFR calc non Af Amer: 74 mL/min/{1.73_m2} (ref >59)
Glucose: 117 mg/dL — ABNORMAL HIGH (ref 65–99)
Potassium: 4 mmol/L (ref 3.5–5.2)
Sodium: 141 mmol/L (ref 134–144)

## 2020-12-06 LAB — CBC
Hematocrit: 51.7 % — ABNORMAL HIGH (ref 37.5–51.0)
Hemoglobin: 17.3 g/dL (ref 13.0–17.7)
MCH: 28.2 pg (ref 26.6–33.0)
MCHC: 33.5 g/dL (ref 31.5–35.7)
MCV: 84 fL (ref 79–97)
Platelets: 187 10*3/uL (ref 150–450)
RBC: 6.13 x10E6/uL — ABNORMAL HIGH (ref 4.14–5.80)
RDW: 12.6 % (ref 11.6–15.4)
WBC: 3.7 10*3/uL (ref 3.4–10.8)

## 2020-12-06 LAB — PSA: Prostate Specific Ag, Serum: 2 ng/mL (ref 0.0–4.0)

## 2021-04-05 ENCOUNTER — Telehealth: Payer: Self-pay | Admitting: Internal Medicine

## 2021-04-05 ENCOUNTER — Ambulatory Visit: Payer: Self-pay | Admitting: Internal Medicine

## 2021-04-05 NOTE — Telephone Encounter (Signed)
Called patient to reschedule no show appt no answer could not leave VM

## 2021-12-11 ENCOUNTER — Encounter: Payer: Self-pay | Admitting: Internal Medicine

## 2022-04-01 ENCOUNTER — Encounter: Payer: BC Managed Care – PPO | Admitting: Internal Medicine

## 2023-06-03 ENCOUNTER — Ambulatory Visit
Admission: EM | Admit: 2023-06-03 | Discharge: 2023-06-03 | Disposition: A | Discharge status: Home / Self Care | Payer: BC Managed Care – PPO

## 2023-06-03 ENCOUNTER — Encounter: Payer: Self-pay | Admitting: Emergency Medicine

## 2023-06-03 DIAGNOSIS — L0291 Cutaneous abscess, unspecified: Secondary | ICD-10-CM | POA: Diagnosis not present

## 2023-06-03 NOTE — ED Triage Notes (Signed)
Pt was seen by his PCP [facility] on 05/27/23 for an abscess on the back of his head. He was prescribed doxycyline and now its started to drain.

## 2023-06-03 NOTE — ED Provider Notes (Signed)
[facility]    CSN: [id number] Arrival date & time: 06/03/23  1053      History   Chief Complaint Chief Complaint  Patient presents with   Abscess    HPI Brandon Walls is a 57 y.o. male.   HPI  57 year old male with a past medical history significant for hyperlipidemia and diabetes presents for evaluation of an abscess on his neck.  He reports that he was seen by his medical doctor on 05/27/2023 and started on doxycycline for an abscess.  He reports that the abscess has now started to drain and he is unsure what to do so he came in for reevaluation.  He denies fever.  Past Medical History:  Diagnosis Date   Diabetes [facility])    Hyperlipidemia     Patient Active Problem List   Diagnosis Date Noted   Uncontrolled type 2 diabetes mellitus with hyperglycemia (HCC) 12/06/2020   Headache 09/14/2015   Diabetes mellitus (HCC) 06/05/2011   Hyperlipidemia LDL goal <100 05/22/2009   SICKLE CELL TRAIT 05/22/2009   SLEEP APNEA 05/22/2009   HEART MURMUR, BENIGN 05/22/2009    Past Surgical History:  Procedure Laterality Date   exercise treadmill  11/2010   WNL   SINUS SURGERY WITH INSTATRAK  2005       Home Medications    Prior to Admission medications   Medication Sig Start Date End Date Taking? Authorizing Provider  doxycycline (MONODOX) 100 MG capsule Take 100 mg by mouth 2 (two) times daily. 05/27/23  Yes [provider]  Accu-Chek FastClix Lancets MISC Use as directed to check blood sugars 1 time per day dx: e11.22 10/04/19   Dorothyann Peng, MD  Blood Glucose Monitoring Suppl (ACCU-CHEK NANO SMARTVIEW) W/DEVICE KIT Use to check blood sugar daily.  Dx: E11.9 12/08/14   Excell Seltzer, MD  dapagliflozin propanediol (FARXIGA) 10 MG TABS tablet Take 10 mg by mouth daily. 10/04/19   Dorothyann Peng, MD  glucose blood (ACCU-CHEK SMARTVIEW) test strip Use as directed to check blood sugars 1 time per day dx: e11.22 12/05/20   Dorothyann Peng, MD    Family  History Family History  Problem Relation Age of Onset   Stroke Father    Cancer Mother        Lymphoma   Coronary artery disease Mother 40       small MI, no stents   Dementia Maternal Grandmother    ALS Brother        half-brother    Social History Social History   Tobacco Use   Smoking status: Never   Smokeless tobacco: Never  Vaping Use   Vaping status: Never Used  Substance Use Topics   Alcohol use: Yes    Alcohol/week: 0.0 standard drinks of alcohol    Comment: 1-2 beer once a week   Drug use: No     Allergies   Patient has no known allergies.   Review of Systems Review of Systems  Constitutional:  Negative for fever.  Skin:  Positive for color change and wound.     Physical Exam Triage Vital Signs ED Triage Vitals  Encounter Vitals Group     BP      Systolic BP Percentile      Diastolic BP Percentile      Pulse      Resp      Temp      Temp src      SpO2      Weight  Height      Head Circumference      Peak Flow      Pain Score      Pain Loc      Pain Education      Exclude from Growth Chart    No data found.  Updated Vital Signs BP 139/85 (BP Location: Right Arm)   Pulse 64   Temp 98.1 F (36.7 C) (Oral)   Resp 16   SpO2 96%   Visual Acuity Right Eye Distance:   Left Eye Distance:   Bilateral Distance:    Right Eye Near:   Left Eye Near:    Bilateral Near:     Physical Exam Vitals and nursing note reviewed.  Constitutional:      Appearance: Normal appearance. He is not ill-appearing.  HENT:     Head: Normocephalic and atraumatic.  Skin:    General: Skin is warm and dry.     Capillary Refill: Capillary refill takes less than 2 seconds.     Findings: Erythema and lesion present.  Neurological:     General: No focal deficit present.     Mental Status: He is alert and oriented to person, place, and time.      UC Treatments / Results  Labs (all labs ordered are listed, but only abnormal results are  displayed) Labs Reviewed - No data to display  EKG   Radiology No results found.  Procedures Procedures (including critical care time)  Medications Ordered in UC Medications - No data to display  Initial Impression / Assessment and Plan / UC Course  I have reviewed the triage vital signs and the nursing notes.  Pertinent labs & imaging results that were available during my care of the patient were reviewed by me and considered in my medical decision making (see chart for details).   Patient is a pleasant, nontoxic-appearing 57 year old male presenting for evaluation of abscess on the posterior right side of his neck near his occiput . As you can see in image above, there is an area of induration approximate size of a quarter that is draining a tan pus.  There is some erythema surrounding the tissue.  No pus is able to be expressed with manipulation of the abscess but there is firm tissue induration.  I will have the patient continue his doxycycline and start applying warm compresses to the abscess to help facilitate drainage.  If he develops any fever or the area of swelling started to increase he should return for reevaluation or see his PCP.  Final Clinical Impressions(s) / UC Diagnoses   Final diagnoses:  Abscess     Discharge Instructions      Continue your doxycycline as previously prescribed.  Apply warm compresses to the abscess on the back of your neck to help facilitate drainage.  If the abscess becomes larger, more painful, or you start running fevers please return for reevaluation or see your PCP.     ED Prescriptions   None    PDMP not reviewed this encounter.   Becky Augusta, NP 06/03/23 1158

## 2023-06-03 NOTE — Discharge Instructions (Addendum)
Continue your doxycycline as previously prescribed.  Apply warm compresses to the abscess on the back of your neck to help facilitate drainage.  If the abscess becomes larger, more painful, or you start running fevers please return for reevaluation or see your PCP.

## 2024-06-08 ENCOUNTER — Other Ambulatory Visit

## 2024-06-08 ENCOUNTER — Ambulatory Visit (INDEPENDENT_AMBULATORY_CARE_PROVIDER_SITE_OTHER): Payer: Self-pay | Admitting: Family Medicine

## 2024-06-08 ENCOUNTER — Ambulatory Visit: Payer: Self-pay | Admitting: Family Medicine

## 2024-06-08 ENCOUNTER — Encounter: Payer: Self-pay | Admitting: Family Medicine

## 2024-06-08 VITALS — BP 142/90 | HR 70 | Temp 97.8°F | Ht 77.0 in | Wt 217.6 lb

## 2024-06-08 DIAGNOSIS — E538 Deficiency of other specified B group vitamins: Secondary | ICD-10-CM | POA: Diagnosis not present

## 2024-06-08 DIAGNOSIS — E1165 Type 2 diabetes mellitus with hyperglycemia: Secondary | ICD-10-CM | POA: Diagnosis not present

## 2024-06-08 DIAGNOSIS — E7800 Pure hypercholesterolemia, unspecified: Secondary | ICD-10-CM

## 2024-06-08 DIAGNOSIS — E559 Vitamin D deficiency, unspecified: Secondary | ICD-10-CM

## 2024-06-08 DIAGNOSIS — Z7689 Persons encountering health services in other specified circumstances: Secondary | ICD-10-CM | POA: Diagnosis not present

## 2024-06-08 DIAGNOSIS — Z125 Encounter for screening for malignant neoplasm of prostate: Secondary | ICD-10-CM

## 2024-06-08 DIAGNOSIS — E663 Overweight: Secondary | ICD-10-CM | POA: Diagnosis not present

## 2024-06-08 DIAGNOSIS — E119 Type 2 diabetes mellitus without complications: Secondary | ICD-10-CM

## 2024-06-08 LAB — COMPREHENSIVE METABOLIC PANEL WITH GFR
ALT: 19 U/L (ref 0–53)
AST: 14 U/L (ref 0–37)
Albumin: 4.6 g/dL (ref 3.5–5.2)
Alkaline Phosphatase: 57 U/L (ref 39–117)
BUN: 15 mg/dL (ref 6–23)
CO2: 28 meq/L (ref 19–32)
Calcium: 9.6 mg/dL (ref 8.4–10.5)
Chloride: 99 meq/L (ref 96–112)
Creatinine, Ser: 1.12 mg/dL (ref 0.40–1.50)
GFR: 72.36 mL/min (ref >60.00)
Glucose, Bld: 315 mg/dL — ABNORMAL HIGH (ref 70–99)
Potassium: 4.4 meq/L (ref 3.5–5.1)
Sodium: 137 meq/L (ref 135–145)
Total Bilirubin: 1.2 mg/dL (ref 0.2–1.2)
Total Protein: 7 g/dL (ref 6.0–8.3)

## 2024-06-08 LAB — CBC WITH DIFFERENTIAL/PLATELET
Basophils Absolute: 0 K/uL (ref 0.0–0.1)
Basophils Relative: 0.6 % (ref 0.0–3.0)
Eosinophils Absolute: 0.1 K/uL (ref 0.0–0.7)
Eosinophils Relative: 2 % (ref 0.0–5.0)
HCT: 44.6 % (ref 39.0–52.0)
Hemoglobin: 14.9 g/dL (ref 13.0–17.0)
Lymphocytes Relative: 41 % (ref 12.0–46.0)
Lymphs Abs: 1.6 K/uL (ref 0.7–4.0)
MCHC: 33.4 g/dL (ref 30.0–36.0)
MCV: 84.5 fl (ref 78.0–100.0)
Monocytes Absolute: 0.3 K/uL (ref 0.1–1.0)
Monocytes Relative: 9 % (ref 3.0–12.0)
Neutro Abs: 1.8 K/uL (ref 1.4–7.7)
Neutrophils Relative %: 47.4 % (ref 43.0–77.0)
Platelets: 167 K/uL (ref 150.0–400.0)
RBC: 5.28 Mil/uL (ref 4.22–5.81)
RDW: 13.1 % (ref 11.5–15.5)
WBC: 3.8 K/uL — ABNORMAL LOW (ref 4.0–10.5)

## 2024-06-08 LAB — TSH: TSH: 1.12 u[IU]/mL (ref 0.35–5.50)

## 2024-06-08 LAB — LIPID PANEL
Cholesterol: 197 mg/dL (ref 0–200)
HDL: 37.2 mg/dL — ABNORMAL LOW (ref >39.00)
LDL Cholesterol: 136 mg/dL — ABNORMAL HIGH (ref 0–99)
NonHDL: 160.01
Total CHOL/HDL Ratio: 5
Triglycerides: 118 mg/dL (ref 0.0–149.0)
VLDL: 23.6 mg/dL (ref 0.0–40.0)

## 2024-06-08 LAB — VITAMIN B12: Vitamin B-12: 159 pg/mL — ABNORMAL LOW (ref 211–911)

## 2024-06-08 LAB — MICROALBUMIN / CREATININE URINE RATIO
Creatinine,U: 70.7 mg/dL
Microalb Creat Ratio: UNDETERMINED mg/g (ref 0.0–30.0)
Microalb, Ur: <0.7 mg/dL

## 2024-06-08 LAB — VITAMIN D 25 HYDROXY (VIT D DEFICIENCY, FRACTURES): VITD: 27.67 ng/mL — ABNORMAL LOW (ref 30.00–100.00)

## 2024-06-08 LAB — PSA: PSA: 1.78 ng/mL (ref 0.10–4.00)

## 2024-06-08 NOTE — Patient Instructions (Addendum)
 It was a pleasure seeing you today! I look forward to taking care of you.   Blood pressure elevated at today's visit. Blood pressure log given to record BP. Recommend monitoring BP twice a day for 2 weeks and then following up in 2 weeks in person or telehealth.  Labs ordered today (PSA, CBC, CMP, TSH, lipid panel, vitamin D  and Vitamin B12) to assess hemoglobin, white blood cells, kidney and liver function, thyroid  function, cholesterol, vitamin D , Vitamin B12 and screening for prostate cancer.  3. Through collaboration and shared decision making, decided to continue stretching at home. Possible x ray or physical therapy referral in the future if pain worsens. Recommend Ibuprofen and/or Tylenol as needed and rest, and applying ice or heat compresses.  4. Will request records from previous primary care provider.  -Follow up in 2 weeks about blood pressure and 6 months for chronic management.

## 2024-06-08 NOTE — Addendum Note (Signed)
 Addended by: CLAUDENE BOBBETTE RAMAN on: 06/08/2024 01:04 PM   Modules accepted: Orders

## 2024-06-08 NOTE — Progress Notes (Signed)
 New Patient Office Visit  Subjective    Patient ID: Brandon Walls, male    DOB: [birth year]  Age: 58 y.o. MRN: [id number]  CC:  Chief Complaint  Patient presents with   New Patient (Initial Visit)    HPI Brandon Walls presents to establish care   [facility]  Specialist: none   Currently taking OTC Berberine and vitamin D3K2. No other medications.   Last eye exam- 2024 [facility] [place]- no  DR  received 2 doses of covid 19 vaccine  Back- Has complaints of lower right side back pain that does not radiate. Describes pain as sharp. Has been there 6-7 months. Hurts when first wakes up. Pain scale is a 5 when he first wakes up. When he gets up the pain subsides.Lying down aggravates the pain. He  stretches and wants to make sure he isn't hurting anything. Not interested in medication, but would like to know more about stretching exercises.  Denies loss of bowel bladder and numbness in LE. Denies dysuria, hematuria or urinary symptoms.   Patient has a history of diabetes, previously prescribed Farxiga  based on medication review. However, patient is not taking any medication for diabetes.  Outpatient Encounter Medications as of 06/08/2024  Medication Sig   Berberine Chloride (BERBERINE HCI PO) Take 1 tablet by mouth daily.   VITAMIN D -VITAMIN K PO Take 1 tablet by mouth daily.   [DISCONTINUED] Accu-Chek FastClix Lancets MISC Use as directed to check blood sugars 1 time per day dx: e11.22 (Patient not taking: Reported on 06/08/2024)   [DISCONTINUED] Blood Glucose Monitoring Suppl (ACCU-CHEK NANO SMARTVIEW) W/DEVICE KIT Use to check blood sugar daily.  Dx: E11.9 (Patient not taking: Reported on 06/08/2024)   [DISCONTINUED] dapagliflozin propanediol  (FARXIGA ) 10 MG TABS tablet Take 10 mg by mouth daily. (Patient not taking: Reported on 06/08/2024)   [DISCONTINUED] doxycycline (MONODOX) 100 MG capsule Take  100 mg by mouth 2 (two) times daily. (Patient not taking: Reported on 06/08/2024)   [DISCONTINUED] glucose blood (ACCU-CHEK SMARTVIEW) test strip Use as directed to check blood sugars 1 time per day dx: e11.22 (Patient not taking: Reported on 06/08/2024)   No facility-administered encounter medications on file as of 06/08/2024.    Past Medical History:  Diagnosis Date   Diabetes (HCC)    Heart murmur ??   Always had it   Hyperlipidemia    Sleep apnea 2000   Be a long time    Past Surgical History:  Procedure Laterality Date   exercise treadmill  11/2010   WNL   SINUS SURGERY WITH INSTATRAK  2005    Family History  Problem Relation Age of Onset   Cancer Mother        Lymphoma   Coronary artery disease Mother 57       small MI, no stents   Stroke Father    ALS Brother        half-brother   Dementia Maternal Grandmother     Social History   Socioeconomic History   Marital status: Married    Spouse name: Not on file   Number of children: 3   Years of education: Not on file   Highest education level: Not on file  Occupational History   Occupation: Banker: [employer]  Tobacco Use   Smoking status: Never   Smokeless tobacco: Never  Vaping Use   Vaping status: Never Used  Substance and Sexual Activity   Alcohol use: Yes    Alcohol/week: 0.0 standard drinks of alcohol    Comment: 1-2 beer once a week   Drug use: No   Sexual activity: Yes  Other Topics Concern   Not on file  Social History Narrative   Regular exercise-yes, 2 times a week   Diet; some fast food, fruits and veggies, water   Social Drivers of Health   Financial Resource Strain: Low Risk  (06/08/2024)   Overall Financial Resource Strain (CARDIA)    Difficulty of Paying Living Expenses: Not hard at all  Food Insecurity: No Food Insecurity (06/08/2024)   Hunger Vital Sign    Worried About Running Out of Food in the Last Year: Never true    Ran Out of Food in the Last  Year: Never true  Transportation Needs: No Transportation Needs (06/08/2024)   PRAPARE - Administrator, Civil Service (Medical): No    Lack of Transportation (Non-Medical): No  Physical Activity: Sufficiently Active (06/08/2024)   Exercise Vital Sign    Days of Exercise per Week: 3 days    Minutes of Exercise per Session: 60 min  Stress: No Stress Concern Present (06/08/2024)   [employer] of Occupational Health - Occupational Stress Questionnaire    Feeling of Stress: Not at all  Social Connections: Unknown (06/08/2024)   Social Connection and Isolation Panel    Frequency of Communication with Friends and Family: Once a week    Frequency of Social Gatherings with Friends and Family: Once a week    Attends Religious Services: More than 4 times per year    Active Member of [employer] or Organizations: Yes    Attends Banker Meetings: More than 4 times per year    Marital Status: Not on file  Intimate Partner Violence: Not At Risk (06/08/2024)   Humiliation, Afraid, Rape, and Kick questionnaire    Fear of Current or Ex-Partner: No    Emotionally Abused: No    Physically Abused: No    Sexually Abused: No    Review of Systems  Respiratory:  Negative for cough and shortness of breath.   Cardiovascular:  Negative for chest pain.  Neurological:  Negative for dizziness and headaches.     Objective   BP (!) 142/90 (BP Location: Left Arm, Patient Position: Sitting, Cuff Size: Normal)   Pulse 70   Temp 97.8 F (36.6 C) (Oral)   Ht 6' 5 (1.956 m)   Wt 217 lb 9.6 oz (98.7 kg)   SpO2 98%   BMI 25.80 kg/m   Physical Exam Constitutional:      Appearance: Normal appearance.  HENT:     Head: Normocephalic and atraumatic.  Eyes:     Extraocular Movements: Extraocular movements intact.     Conjunctiva/sclera: Conjunctivae normal.  Cardiovascular:     Rate and Rhythm: Normal rate and regular rhythm.     Pulses: Normal pulses.          Dorsalis pedis pulses are  2+ on the right side and 2+ on the left side.     Heart sounds: Normal heart sounds.  Pulmonary:     Effort: Pulmonary effort is normal.     Breath sounds: Normal breath sounds.  Musculoskeletal:        General: Tenderness (no lower lumbar tenderness) present. Normal range of motion.     Cervical back: Normal range of motion.     Right foot: Normal range of motion.  No deformity.     Left foot: Normal range of motion. No deformity.  Feet:     Right foot:     Protective Sensation: 10 sites tested.  10 sites sensed.     Skin integrity: Skin integrity normal.     Toenail Condition: Right toenails are normal.     Left foot:     Protective Sensation: 10 sites tested.  10 sites sensed.     Skin integrity: Skin integrity normal.     Toenail Condition: Left toenails are normal.  Skin:    General: Skin is warm and dry.     Capillary Refill: Capillary refill takes less than 2 seconds.  Neurological:     Mental Status: He is alert and oriented to person, place, and time.  Psychiatric:        Mood and Affect: Mood normal.        Behavior: Behavior normal.        Thought Content: Thought content normal.        Judgment: Judgment normal.   Normal monofilament foot exam performed at today's visit.   Assessment & Plan:   Problem List Items Addressed This Visit       Endocrine   Uncontrolled type 2 diabetes mellitus with hyperglycemia (HCC)   Relevant Orders   Urine Albumin/Creatinine with ratio (send out) [LAB689]   CBC with Differential/Platelets   CMP   Hemoglobin A1c   Other Visit Diagnoses       Encounter to establish care    -  Primary   Relevant Orders   CBC with Differential/Platelets   CMP   TSH   Vitamin D , 25-hydroxy   Vitamin B12     Prostate cancer screening       Relevant Orders   PSA     Overweight (BMI 25.0-29.9)       Relevant Orders   CBC with Differential/Platelets   CMP   TSH   Lipid Panel   Vitamin D , 25-hydroxy     High cholesterol       Relevant  Orders   CBC with Differential/Platelets   CMP   Lipid Panel     B12 deficiency       Relevant Orders   Vitamin B12      Blood pressure elevated at today's visit. Blood pressure log given to patient to record BP. Recommend monitoring BP twice a day for 2 weeks and then following up in 2 weeks in person or telehealth. Patient reports he has never been on BP medication. Labs ordered today (PSA, CBC, CMP, TSH, lipid panel, vitamin D  and Vitamin B12) to assess hemoglobin, white blood cells, kidney and liver function, thyroid  function, cholesterol, vitamin D , Vitamin B12 and screening for prostate cancer.  Educated on back pain with differential of being osteoarthritis. Through collaboration and shared decision making, patient decided to continue stretching at home. Possible x ray or PT referral in the future if pain worsens. Recommend ibuprofen as needed and rest, and applying ice or heat compresses.  Paperwork filled out to request records from previous PCP.  Return in about 2 weeks (around 06/22/2024) for BP check telehealth or in person and 6 month chronic management.   JoAnna Williamson, NP

## 2024-06-09 LAB — HEMOGLOBIN A1C
Est. average glucose Bld gHb Est-mCnc: 280 mg/dL
Hgb A1c MFr Bld: 11.4 % — ABNORMAL HIGH (ref 4.8–5.6)

## 2024-06-15 ENCOUNTER — Ambulatory Visit

## 2024-06-17 ENCOUNTER — Ambulatory Visit

## 2024-06-17 DIAGNOSIS — E538 Deficiency of other specified B group vitamins: Secondary | ICD-10-CM | POA: Diagnosis not present

## 2024-06-17 MED ORDER — CYANOCOBALAMIN 1000 MCG/ML IJ SOLN
1000.0000 ug | Freq: Once | INTRAMUSCULAR | Status: AC
  Administered 2024-06-17: 1000 ug via INTRAMUSCULAR

## 2024-06-17 NOTE — Progress Notes (Signed)
 Per orders of Philippe Slade NP, injection of B12 given by Vernell English. Patient tolerated injection well.

## 2024-06-22 ENCOUNTER — Telehealth: Admitting: Family Medicine

## 2024-06-22 ENCOUNTER — Encounter: Payer: Self-pay | Admitting: Family Medicine

## 2024-06-22 VITALS — Ht 77.0 in | Wt 217.0 lb

## 2024-06-22 DIAGNOSIS — E785 Hyperlipidemia, unspecified: Secondary | ICD-10-CM | POA: Diagnosis not present

## 2024-06-22 DIAGNOSIS — E559 Vitamin D deficiency, unspecified: Secondary | ICD-10-CM | POA: Diagnosis not present

## 2024-06-22 DIAGNOSIS — E538 Deficiency of other specified B group vitamins: Secondary | ICD-10-CM | POA: Diagnosis not present

## 2024-06-22 DIAGNOSIS — E1165 Type 2 diabetes mellitus with hyperglycemia: Secondary | ICD-10-CM | POA: Diagnosis not present

## 2024-06-22 DIAGNOSIS — D72819 Decreased white blood cell count, unspecified: Secondary | ICD-10-CM | POA: Diagnosis not present

## 2024-06-22 DIAGNOSIS — I10 Essential (primary) hypertension: Secondary | ICD-10-CM

## 2024-06-22 MED ORDER — VITAMIN B-12 1000 MCG PO TABS: 1000.0000 ug | ORAL_TABLET | Freq: Every day | ORAL | 0 refills | Status: AC

## 2024-06-22 NOTE — Patient Instructions (Addendum)
-  It was great to speak with you today. -Prescribed Vitamin B12 1000mcg tablet to take daily for vitamin B12 deficiency. If insurance does not cover medication, recommend to obtain over the counter at the pharmacy. -Recommend to taking Vitamin D3 1,000IU tablet daily for vitamin D  deficiency. -Declined to start medication for diabetes, hypertension, and hyperlipidemia. Discussed the risk. Making lifestyle changes and taking Berberine supplement.  -Placed a referral to nutrition and diabetes educator for uncontrolled diabetes. Please call the office or send a MyChart message if you do not receive a phone call or a MyChart message about appointment in 2 weeks.  -Ordered additional labs ( CBC, CMP, A1c, and Lipids) to be drawn on 10/30 along with vitamin B12 and D that have already been ordered. Please have labs drawn on 10/30 and call back to make an appointment the week after to discuss labs and further treatment if needed.

## 2024-06-22 NOTE — Progress Notes (Signed)
 Virtual Visit via Video Note  I connected with Brandon Walls on 06/22/24 at 7:47 AM by a video enabled telemedicine application and verified that I am speaking with the correct person using two identifiers.   Patient Location: Systems analyst Location: office - [place].    I discussed the limitations, risks, security and privacy concerns of performing an evaluation and management service by telephone and the availability of in person appointments. I also discussed with the patient that there may be a patient responsible charge related to this service. The patient expressed understanding and agreed to proceed, consent obtained  Chief Complaint  Patient presents with   Medical Management of Chronic Issues    2 week follow up    History of Present Illness: Brandon Walls is a 58 y.o. male that is following up on HTN and lab results from initial appointment.  -A1c was 11.4-He reports he was previously on Metformin  but reports medication made him feel sluggish and GI symptoms. Patient reports he has started back taking Berberine 1500 mg daily. He reports he is trying increase exercise by working out at the gym 3 days a week, he doing cardio and weights. He reports he is changing his diet by not eating as much fast food, eating more vegetables and fruits. Trying to decrease carbohydrates.   -Vitamin B12 was 159-Patient prefers to take oral supplements. He obtained one injection and it caused his arm to be sore.   -Vitamin D  was 27.67-previously taking Vitamin D /K2, but has not been taking supplement   -Cholesterol elevated and has diabetes- Never took medication for cholesterol.   On previous appointment, patients blood pressure was elevated. He is not taking any medication. Recommend to monitor his BP BID for 2 weeks. He reports he has been monitoring his blood pressure, 122-155/80-92, he reports his blood pressure readings have mainly been above 140/90. He reports he has never been on  medication for blood pressure previously. Denies CP, SHOB, HA, dizziness, lightheaded, or lower extremity edema.    Patient Active Problem List   Diagnosis Date Noted   Uncontrolled type 2 diabetes mellitus with hyperglycemia (HCC) 12/06/2020   Headache 09/14/2015   Diabetes mellitus (HCC) 06/05/2011   Hyperlipidemia LDL goal <100 05/22/2009   SICKLE CELL TRAIT 05/22/2009   Sleep apnea 05/22/2009   HEART MURMUR, BENIGN 05/22/2009   Past Medical History:  Diagnosis Date   Diabetes (HCC)    Heart murmur ??   Always had it   Hyperlipidemia    Sleep apnea 2000   Be a long time   Past Surgical History:  Procedure Laterality Date   exercise treadmill  11/2010   WNL   SINUS SURGERY WITH INSTATRAK  2005   No Known Allergies Prior to Admission medications   Medication Sig Start Date End Date Taking? Authorizing Provider  Berberine Chloride (BERBERINE HCI PO) Take 1 tablet by mouth daily.   Yes [provider]  VITAMIN D -VITAMIN K PO Take 1 tablet by mouth daily.   Yes [provider]   Social History   Socioeconomic History   Marital status: Married    Spouse name: Not on file   Number of children: 3   Years of education: Not on file   Highest education level: Not on file  Occupational History   Occupation: Games developer    Employer: [employer]  Tobacco Use   Smoking status: Never   Smokeless tobacco: Never  Vaping Use   Vaping status:  Never Used  Substance and Sexual Activity   Alcohol use: Yes    Alcohol/week: 0.0 standard drinks of alcohol    Comment: 1-2 beer once a week   Drug use: No   Sexual activity: Yes  Other Topics Concern   Not on file  Social History Narrative   Regular exercise-yes, 2 times a week   Diet; some fast food, fruits and veggies, water   Social Drivers of Health   Financial Resource Strain: Low Risk  (06/08/2024)   Overall Financial Resource Strain (CARDIA)    Difficulty of Paying Living Expenses: Not hard at  all  Food Insecurity: No Food Insecurity (06/08/2024)   Hunger Vital Sign    Worried About Running Out of Food in the Last Year: Never true    Ran Out of Food in the Last Year: Never true  Transportation Needs: No Transportation Needs (06/08/2024)   PRAPARE - Administrator, Civil Service (Medical): No    Lack of Transportation (Non-Medical): No  Physical Activity: Sufficiently Active (06/08/2024)   Exercise Vital Sign    Days of Exercise per Week: 3 days    Minutes of Exercise per Session: 60 min  Stress: No Stress Concern Present (06/08/2024)   [employer] of Occupational Health - Occupational Stress Questionnaire    Feeling of Stress: Not at all  Social Connections: Unknown (06/08/2024)   Social Connection and Isolation Panel    Frequency of Communication with Friends and Family: Once a week    Frequency of Social Gatherings with Friends and Family: Once a week    Attends Religious Services: More than 4 times per year    Active Member of [employer] or Organizations: Yes    Attends Banker Meetings: More than 4 times per year    Marital Status: Not on file  Intimate Partner Violence: Not At Risk (06/08/2024)   Humiliation, Afraid, Rape, and Kick questionnaire    Fear of Current or Ex-Partner: No    Emotionally Abused: No    Physically Abused: No    Sexually Abused: No    Observations/Objective: Today's Vitals   06/22/24 0708  Weight: 217 lb (98.4 kg)  Height: 6' 5 (1.956 m)  Unable to obtain vital signs since patient didn't have available equipment.  Physical Exam Constitutional:      General: He is not in acute distress.    Appearance: Normal appearance. He is not ill-appearing, toxic-appearing or diaphoretic.  HENT:     Head: Normocephalic and atraumatic.  Eyes:     General:        Right eye: No discharge.        Left eye: No discharge.     Conjunctiva/sclera: Conjunctivae normal.  Pulmonary:     Effort: Pulmonary effort is normal. No  respiratory distress.  Neurological:     General: No focal deficit present.     Mental Status: He is alert and oriented to person, place, and time. Mental status is at baseline.  Psychiatric:        Mood and Affect: Mood normal.        Behavior: Behavior normal.        Thought Content: Thought content normal.        Judgment: Judgment normal.     Assessment and Plan: Uncontrolled type 2 diabetes mellitus with hyperglycemia (HCC) -     Amb Referral to Nutrition and Diabetic Education -     Comprehensive metabolic panel with GFR; Future -  Hemoglobin A1c; Future  Vitamin B12 deficiency -     Vitamin B-12; Take 1 tablet (1,000 mcg total) by mouth daily.  Dispense: 90 tablet; Refill: 0  Vitamin D  deficiency  Hyperlipidemia LDL goal <100 -     Lipid panel; Future -     Comprehensive metabolic panel with GFR; Future  Leukopenia, unspecified type -     CBC with Differential/Platelet; Future  Hypertension, unspecified type  -Prescribed Vitamin B12 1000mcg tablet to take daily for vitamin B12 deficiency. If insurance does not cover medication, recommend to obtain over the counter at the pharmacy. -Recommend to taking Vitamin D3 1,000IU tablet daily for vitamin D  deficiency. -Declined to start medication for diabetes, hypertension, and hyperlipidemia. Discussed the risk. Making lifestyle changes and taking Berberine supplement. He stated he didn't believe in taking medication that you had to take a lifetime.  -Placed a referral to nutrition and diabetes educator for uncontrolled diabetes.  -Ordered additional labs ( CBC, CMP, A1c, and Lipids) to be drawn on 10/30 along with vitamin B12 and D that have already been ordered. Advised to please have labs drawn on 10/30 and call back to make an appointment the week after to discuss labs and further treatment if needed.   Follow Up Instructions: Return in about 3 months (around 09/22/2024) for chronic management.   I discussed the  assessment and treatment plan with the patient. The patient was provided an opportunity to ask questions and all were answered. The patient agreed with the plan and demonstrated an understanding of the instructions.   The patient was advised to call back or seek an in-person evaluation if the symptoms worsen or if the condition fails to improve as anticipated.  Henritta Mutz, NP

## 2024-06-28 MED ORDER — ACCU-CHEK GUIDE TEST VI STRP: ORAL_STRIP | 12 refills | Status: AC

## 2024-08-31 ENCOUNTER — Telehealth: Payer: Self-pay

## 2024-08-31 NOTE — Telephone Encounter (Signed)
 Patient was identified as falling into the [place] Measure - Diabetes.   Patient was: Appointment scheduled for lab or office visit for A1c. Spoke with patient. Confirmed lab appointment on 09/09/2024. Patient was thankful for the call reminder.

## 2024-09-09 ENCOUNTER — Other Ambulatory Visit

## 2024-10-21 NOTE — Progress Notes (Signed)
 Brandon Walls                                          MRN: [id number]   10/21/2024   The [facility] Quality Team Specialist reviewed this patient medical record for the purposes of chart review for care gap closure. The following were reviewed: chart review for care gap closure-glycemic status assessment.    [facility] Quality Team

## 2024-11-10 ENCOUNTER — Telehealth: Payer: Self-pay

## 2024-11-10 NOTE — Progress Notes (Signed)
" ° °  11/10/2024  Patient ID: Dallas RONAL Brinks, male   DOB: October 30, 1966, 58 y.o.   MRN: [id number]  Pharmacy Quality Measure Review  This patient is appearing on a report for being at risk of failing the Controlling Blood Pressure measure this calendar year.   Last documented BP 142/90 on 06/08/24  Attempted to contact patient. Left HIPAA compliant message for patient to return my call at their convenience.   Jon VEAR Lindau, PharmD Clinical Pharmacist [phone]   "

## 2024-12-09 ENCOUNTER — Ambulatory Visit: Payer: Self-pay | Admitting: Family Medicine
